# Patient Record
Sex: Female | Born: 1995
Health system: Southern US, Community
[De-identification: ages and names within clinical notes are randomized; demographics above are authoritative.]

## PROBLEM LIST (undated history)

## (undated) DIAGNOSIS — F32A Depression, unspecified: Secondary | ICD-10-CM

## (undated) DIAGNOSIS — R51 Headache: Secondary | ICD-10-CM

## (undated) DIAGNOSIS — F419 Anxiety disorder, unspecified: Secondary | ICD-10-CM

## (undated) DIAGNOSIS — R519 Headache, unspecified: Secondary | ICD-10-CM

## (undated) DIAGNOSIS — F988 Other specified behavioral and emotional disorders with onset usually occurring in childhood and adolescence: Secondary | ICD-10-CM

## (undated) DIAGNOSIS — J45909 Unspecified asthma, uncomplicated: Secondary | ICD-10-CM

## (undated) DIAGNOSIS — F329 Major depressive disorder, single episode, unspecified: Secondary | ICD-10-CM

## (undated) HISTORY — DX: Headache, unspecified: R51.9

## (undated) HISTORY — PX: SEPTOPLASTY: SUR1290

## (undated) HISTORY — DX: Anxiety disorder, unspecified: F41.9

## (undated) HISTORY — DX: Headache: R51

## (undated) HISTORY — DX: Unspecified asthma, uncomplicated: J45.909

## (undated) HISTORY — DX: Depression, unspecified: F32.A

---
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
## 2017-08-30 ENCOUNTER — Encounter (INDEPENDENT_AMBULATORY_CARE_PROVIDER_SITE_OTHER): Payer: Self-pay | Admitting: Pediatrics

## 2017-08-30 ENCOUNTER — Ambulatory Visit (INDEPENDENT_AMBULATORY_CARE_PROVIDER_SITE_OTHER): Payer: Commercial Managed Care - PPO | Admitting: Pediatrics

## 2017-08-30 ENCOUNTER — Telehealth (INDEPENDENT_AMBULATORY_CARE_PROVIDER_SITE_OTHER): Payer: Self-pay | Admitting: Pediatrics

## 2017-08-30 VITALS — BP 112/70 | HR 72 | Ht 66.75 in | Wt 183.0 lb

## 2017-08-30 DIAGNOSIS — G43009 Migraine without aura, not intractable, without status migrainosus: Secondary | ICD-10-CM | POA: Diagnosis not present

## 2017-08-30 DIAGNOSIS — G44219 Episodic tension-type headache, not intractable: Secondary | ICD-10-CM | POA: Diagnosis not present

## 2017-08-30 MED ORDER — MIGRELIEF 200-180-50 MG PO TABS: ORAL_TABLET | ORAL | 5 refills | Status: DC

## 2017-08-30 MED ORDER — ZOLMITRIPTAN 5 MG NA SOLN: NASAL | 5 refills | Status: DC

## 2017-08-30 NOTE — Patient Instructions (Signed)
I will write a note to your Associate August Saucer.  I also wrote a prescription for nasal zolmitriptan.  Please let me know if there is a problem.  My record shows that you are signed up for My Chart.  Make certain that we don't have to change this given her status is now that of an adult.  There are 3 lifestyle behaviors that are important to minimize headaches.  You should sleep 8-9 hours at night time.  Bedtime should be a set time for going to bed and waking up with few exceptions.  You need to drink about 48 ounces of water per day, more on days when you are out in the heat.  This works out to 3 - 16 ounce water bottles per day.  You may need to flavor the water so that you will be more likely to drink it.  Do not use Kool-Aid or other sugar drinks because they add empty calories and actually increase urine output.  You need to eat 3 meals per day.  You should not skip meals.  The meal does not have to be a big one.  Make daily entries into the headache calendar and sent it to me at the end of each calendar month.  I will call you or your parents and we will discuss the results of the headache calendar and make a decision about changing treatment if indicated.  You should take 400 mg of ibuprofen with 5 mg of nasal zolmitriptan at the onset of headaches that are severe enough to cause obvious pain and other symptoms.

## 2017-08-30 NOTE — Telephone Encounter (Signed)
Changed pharmacy to the [facility] on [address] and resent prescription.
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
## 2017-08-30 NOTE — Telephone Encounter (Signed)
Pharm[facility] [address]

## 2017-08-30 NOTE — Telephone Encounter (Signed)
°  Who's calling (name and relationship to patient) : Verdell Face (mom) Best contact number: [phone] Provider they see: Sharene Skeans  Reason for call: Mom called stated pharmacy need to be change to [facility] [address] due to insurance.     PRESCRIPTION REFILL ONLY  Name of prescription:  Pharmacy:

## 2017-08-31 ENCOUNTER — Encounter (INDEPENDENT_AMBULATORY_CARE_PROVIDER_SITE_OTHER): Payer: Self-pay | Admitting: Pediatrics

## 2017-09-13 ENCOUNTER — Encounter (INDEPENDENT_AMBULATORY_CARE_PROVIDER_SITE_OTHER): Payer: Self-pay | Admitting: Pediatrics

## 2017-09-13 DIAGNOSIS — F411 Generalized anxiety disorder: Secondary | ICD-10-CM

## 2017-09-13 NOTE — Telephone Encounter (Signed)
Headache calendar from October 2018 on Erin Sharp. 17 days were recorded.  9 days were headache free.  4 days were associated with tension type headaches, 3 required treatment.  There were 4 days of migraines, none were severe.  I recommended starting topiramate.  I contacted the patient by My Chart.

## 2017-09-15 MED ORDER — ESCITALOPRAM OXALATE 10 MG PO TABS: ORAL_TABLET | ORAL | 5 refills | Status: DC

## 2017-10-05 ENCOUNTER — Encounter (INDEPENDENT_AMBULATORY_CARE_PROVIDER_SITE_OTHER): Payer: Self-pay | Admitting: Pediatrics

## 2017-10-08 ENCOUNTER — Encounter (INDEPENDENT_AMBULATORY_CARE_PROVIDER_SITE_OTHER): Payer: Self-pay | Admitting: Pediatrics

## 2017-10-08 NOTE — Telephone Encounter (Signed)
Headache calendar from October 2018 on Erin Sharp. 31 days were recorded.  18 days were headache free.  7 days were associated with tension type headaches, 3 required treatment.  There were 6 days of migraines, none were severe.  Headache calendar from November 2018 on Erin Sharp. 8 days were recorded.  3 days were headache free.  3 days were associated with tension type headaches, 2 required treatment.  There were 2 days of migraines, none were severe.  She had 2 days of menstrual periods with no correlation with migraines.  There is no reason to change current treatment.  I will contact the family.

## 2017-10-10 ENCOUNTER — Encounter (INDEPENDENT_AMBULATORY_CARE_PROVIDER_SITE_OTHER): Payer: Self-pay | Admitting: Pediatrics

## 2017-11-16 ENCOUNTER — Telehealth (INDEPENDENT_AMBULATORY_CARE_PROVIDER_SITE_OTHER): Payer: Self-pay

## 2017-11-16 DIAGNOSIS — F411 Generalized anxiety disorder: Secondary | ICD-10-CM

## 2017-11-16 MED ORDER — ESCITALOPRAM OXALATE 10 MG PO TABS: ORAL_TABLET | ORAL | 5 refills | Status: DC

## 2017-11-16 NOTE — Telephone Encounter (Signed)
Rx has been sent to the pharmacy electronically. ° °

## 2018-09-11 ENCOUNTER — Other Ambulatory Visit: Payer: Self-pay | Admitting: Family Medicine

## 2018-09-11 DIAGNOSIS — R221 Localized swelling, mass and lump, neck: Secondary | ICD-10-CM

## 2018-10-02 ENCOUNTER — Inpatient Hospital Stay: Admission: RE | Admit: 2018-10-02 | Payer: Self-pay | Source: Ambulatory Visit

## 2019-01-08 ENCOUNTER — Other Ambulatory Visit (INDEPENDENT_AMBULATORY_CARE_PROVIDER_SITE_OTHER): Payer: Self-pay | Admitting: Pediatrics

## 2019-01-08 DIAGNOSIS — G43009 Migraine without aura, not intractable, without status migrainosus: Secondary | ICD-10-CM

## 2019-01-14 DIAGNOSIS — R197 Diarrhea, unspecified: Secondary | ICD-10-CM | POA: Diagnosis not present

## 2019-01-14 DIAGNOSIS — R112 Nausea with vomiting, unspecified: Secondary | ICD-10-CM | POA: Diagnosis not present

## 2019-01-15 ENCOUNTER — Other Ambulatory Visit: Payer: Self-pay | Admitting: Gastroenterology

## 2019-01-15 DIAGNOSIS — R1013 Epigastric pain: Secondary | ICD-10-CM

## 2019-01-15 DIAGNOSIS — R112 Nausea with vomiting, unspecified: Secondary | ICD-10-CM

## 2019-01-21 ENCOUNTER — Ambulatory Visit
Admission: RE | Admit: 2019-01-21 | Discharge: 2019-01-21 | Disposition: A | Discharge status: Home / Self Care | Payer: Commercial Managed Care - PPO | Source: Ambulatory Visit | Attending: Gastroenterology | Admitting: Gastroenterology

## 2019-01-21 DIAGNOSIS — K824 Cholesterolosis of gallbladder: Secondary | ICD-10-CM | POA: Diagnosis not present

## 2019-01-21 DIAGNOSIS — R1013 Epigastric pain: Secondary | ICD-10-CM

## 2019-01-21 DIAGNOSIS — R112 Nausea with vomiting, unspecified: Secondary | ICD-10-CM

## 2019-01-25 DIAGNOSIS — K2930 Chronic superficial gastritis without bleeding: Secondary | ICD-10-CM | POA: Diagnosis not present

## 2019-01-25 DIAGNOSIS — R112 Nausea with vomiting, unspecified: Secondary | ICD-10-CM | POA: Diagnosis not present

## 2019-01-25 DIAGNOSIS — R1013 Epigastric pain: Secondary | ICD-10-CM | POA: Diagnosis not present

## 2019-02-12 ENCOUNTER — Ambulatory Visit (INDEPENDENT_AMBULATORY_CARE_PROVIDER_SITE_OTHER): Admitting: Pediatrics

## 2019-06-27 ENCOUNTER — Ambulatory Visit (INDEPENDENT_AMBULATORY_CARE_PROVIDER_SITE_OTHER): Payer: Commercial Managed Care - PPO | Admitting: Physician Assistant

## 2019-06-27 ENCOUNTER — Other Ambulatory Visit: Payer: Self-pay

## 2019-06-27 ENCOUNTER — Encounter: Payer: Self-pay | Admitting: Physician Assistant

## 2019-06-27 DIAGNOSIS — F410 Panic disorder [episodic paroxysmal anxiety] without agoraphobia: Secondary | ICD-10-CM

## 2019-06-27 DIAGNOSIS — F331 Major depressive disorder, recurrent, moderate: Secondary | ICD-10-CM | POA: Diagnosis not present

## 2019-06-27 MED ORDER — HYDROXYZINE HCL 10 MG PO TABS: 10.0000 mg | ORAL_TABLET | Freq: Four times a day (QID) | ORAL | 0 refills | Status: DC | PRN

## 2019-06-27 MED ORDER — SERTRALINE HCL 100 MG PO TABS: 100.0000 mg | ORAL_TABLET | Freq: Every day | ORAL | 1 refills | Status: DC

## 2019-06-27 MED ORDER — BUPROPION HCL ER (XL) 150 MG PO TB24: ORAL_TABLET | ORAL | 1 refills | Status: DC
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
## 2019-07-22 ENCOUNTER — Other Ambulatory Visit: Payer: Self-pay | Admitting: Physician Assistant

## 2019-07-27 ENCOUNTER — Other Ambulatory Visit: Payer: Self-pay | Admitting: Physician Assistant

## 2019-08-02 ENCOUNTER — Encounter: Payer: Self-pay | Admitting: Physician Assistant

## 2019-08-02 ENCOUNTER — Ambulatory Visit (INDEPENDENT_AMBULATORY_CARE_PROVIDER_SITE_OTHER): Payer: Commercial Managed Care - PPO | Admitting: Physician Assistant

## 2019-08-02 DIAGNOSIS — F410 Panic disorder [episodic paroxysmal anxiety] without agoraphobia: Secondary | ICD-10-CM

## 2019-08-02 DIAGNOSIS — F331 Major depressive disorder, recurrent, moderate: Secondary | ICD-10-CM

## 2019-08-02 DIAGNOSIS — F900 Attention-deficit hyperactivity disorder, predominantly inattentive type: Secondary | ICD-10-CM | POA: Diagnosis not present

## 2019-08-02 MED ORDER — BUPROPION HCL ER (XL) 150 MG PO TB24: ORAL_TABLET | ORAL | 1 refills | Status: DC

## 2019-08-02 MED ORDER — ALPRAZOLAM 0.25 MG PO TABS: 0.1250 mg | ORAL_TABLET | Freq: Two times a day (BID) | ORAL | 0 refills | Status: DC | PRN

## 2019-08-02 MED ORDER — SERTRALINE HCL 100 MG PO TABS: 150.0000 mg | ORAL_TABLET | Freq: Every day | ORAL | 1 refills | Status: DC

## 2019-08-02 NOTE — Progress Notes (Signed)
[facility]  Patient ID: ILLA ENLOW,  MRN: [id number]  PCP: Aretta Nip, MD  Date of Evaluation: 08/02/2019 Time spent:15 minutes  Chief Complaint:  Chief Complaint    Anxiety; Depression; Follow-up     Virtual Visit via Telephone Note  I connected with patient by a video enabled telemedicine application or telephone, with their informed consent, and verified patient privacy and that I am speaking with the correct person using two identifiers.  I am private, in my home and the patient is home.   I discussed the limitations, risks, security and privacy concerns of performing an evaluation and management service by telephone and the availability of in person appointments. I also discussed with the patient that there may be a patient responsible charge related to this service. The patient expressed understanding and agreed to proceed.   I discussed the assessment and treatment plan with the patient. The patient was provided an opportunity to ask questions and all were answered. The patient agreed with the plan and demonstrated an understanding of the instructions.   The patient was advised to call back or seek an in-person evaluation if the symptoms worsen or if the condition fails to improve as anticipated.  I provided 15 minutes of non-face-to-face time during this encounter.  HISTORY/CURRENT STATUS: HPI For 6 week med check.  At [facility], we increased Zoloft and started hydroxyzine, which didn't help at all and made her sleepy. Has PA 1-2 per day, can't get her breath, has chest discomfort, sweaty palms, sometimes last for 30 minutes.  She uses breathing techniques that her therapists has taught her and that helps some.  "I feel like I'm dying."   Has a hard time focusing, gets distracted easily.  Thinks that sometimes the panic happens because she can't stay on task.  We had discussed her having psychological testing for ADD/ADHD but she has been unable to get in  anywhere.  The clinic at [facility] has a backlog and she will not be able to get in until the end of next semester.  She has been sad lately but it is triggered by the fact that her boyfriend was cheating on her and she had to kick him out.  "I am okay though."  She still has low energy and motivation.  Not isolating any more than she has to due to the coronavirus pandemic.  Denies suicidal or homicidal thoughts.  Patient denies increased energy with decreased need for sleep, no increased talkativeness, no racing thoughts, no impulsivity or risky behaviors, no increased spending, no increased libido, no grandiosity.  Denies dizziness, syncope, seizures, numbness, tingling, tremor, tics, unsteady gait, slurred speech, confusion. Denies muscle or joint pain, stiffness, or dystonia.  Individual Medical History/ Review of Systems: Changes? :No    Past medications for mental health diagnoses include: Lexapro never helped, hydroxyzine is ineffective and cause drowsiness  Allergies: Patient has no known allergies.  Current Medications:  Current Outpatient Medications:  .  buPROPion (WELLBUTRIN XL) 150 MG 24 hr tablet, TAKE 1 TABLET BY MOUTH EVERY DAY IN THE MORNING, Disp: 30 tablet, Rfl: 1 .  cholecalciferol (VITAMIN D3) 25 MCG (1000 UT) tablet, Take 1,000 Units by mouth daily., Disp: , Rfl:  .  norethindrone (MICRONOR) 0.35 MG tablet, , Disp: , Rfl:  .  zolmitriptan (ZOMIG) 5 MG nasal solution, 1 puff in nostril at onset of migraine, may repeat in 2 hours, Disp: 6 Units, Rfl: 5 .  ALPRAZolam (XANAX) 0.25 MG tablet, Take 0.5-1  tablets (0.125-0.25 mg total) by mouth 2 (two) times daily as needed for anxiety., Disp: 20 tablet, Rfl: 0 .  MIGRELIEF 200-180-50 MG TABS, Take 2 tablets daily (Patient not taking: Reported on 06/27/2019), Disp: 60 tablet, Rfl: 5 .  sertraline (ZOLOFT) 100 MG tablet, Take 1.5 tablets (150 mg total) by mouth daily., Disp: 45 tablet, Rfl: 1 Medication Side Effects: none   Family  Medical/ Social History: Changes? Yes boyfriend was cheating on her, so she kicked him out.   MENTAL HEALTH EXAM:  There were no vitals taken for this visit.There is no height or weight on file to calculate BMI.  General Appearance: unable to assess  Eye Contact:  unable to assess  Speech:  Clear and Coherent  Volume:  Normal  Mood:  Euthymic  Affect:  unable to assess  Thought Process:  Goal Directed  Orientation:  Full (Time, Place, and Person)  Thought Content: Logical   Suicidal Thoughts:  No  Homicidal Thoughts:  No  Memory:  WNL  Judgement:  Good  Insight:  Good  Psychomotor Activity:  unable to assess  Concentration:  Concentration: Good  Recall:  Good  Fund of Knowledge: Good  Language: Good  Assets:  Desire for Improvement  ADL's:  Intact  Cognition: WNL  Prognosis:  Good    DIAGNOSES:    ICD-10-CM   1. Major depressive disorder, recurrent episode, moderate (HCC)  F33.1   2. Panic disorder  F41.0   3. Attention deficit hyperactivity disorder (ADHD), predominantly inattentive type  F90.0     Receiving Psychotherapy: Yes    RECOMMENDATIONS:  PDMP was reviewed. Increase Zoloft to 150 mg to help prevent panic attacks and generalized anxiety. Continue Wellbutrin XL 150 mg q am. Start Xanax 0.25 mg 1/2-1 po bid prn. Discussed benefits, risks, SE all discussed.  She is aware to take this for emergencies only and to take sparingly. Discontinue hydroxyzine. At the next visit, if the anxiety is improved and because I strongly suspect ADD, I might go ahead and treat with a stimulant or at least consider clonidine or guanfacine or Strattera. Continue therapy. Return in 6 weeks.   Melony Overlyeresa Edita Weyenberg, PA-C   This record has been created using [employer] software.  Chart creation errors have been sought, but may not always have been located and corrected. Such creation errors do not reflect on the standard of medical care.

## 2019-08-24 IMAGING — US US ABDOMEN COMPLETE
1 series · 13 of 25 positions shown · non-contrast
Comparison: None.

CLINICAL DATA: Nausea, vomiting, epigastric pain

EXAM:
ABDOMEN ULTRASOUND COMPLETE

[Series 1: us abdomen complete · 0.22mm/px · 13 of 88 slices shown]
[im 1/88]
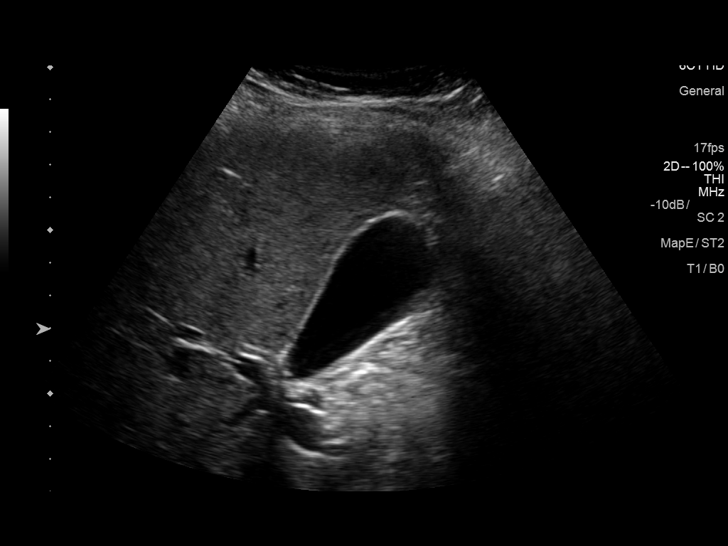
[im 8/88]
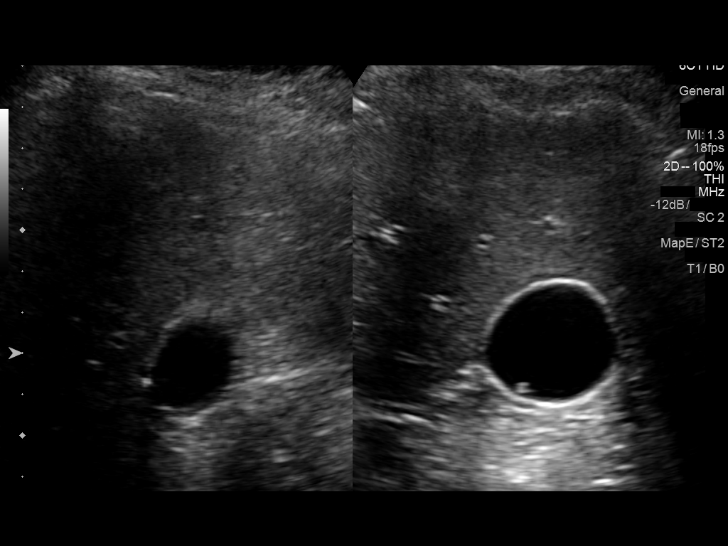
[im 15/88]
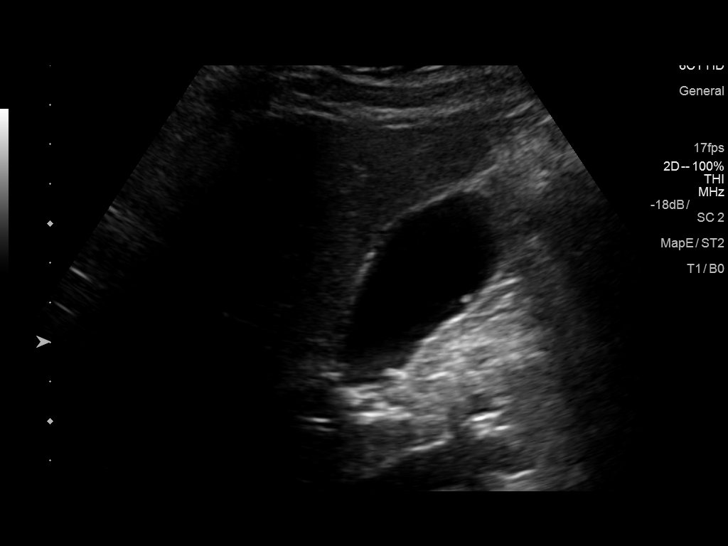
[im 22/88]
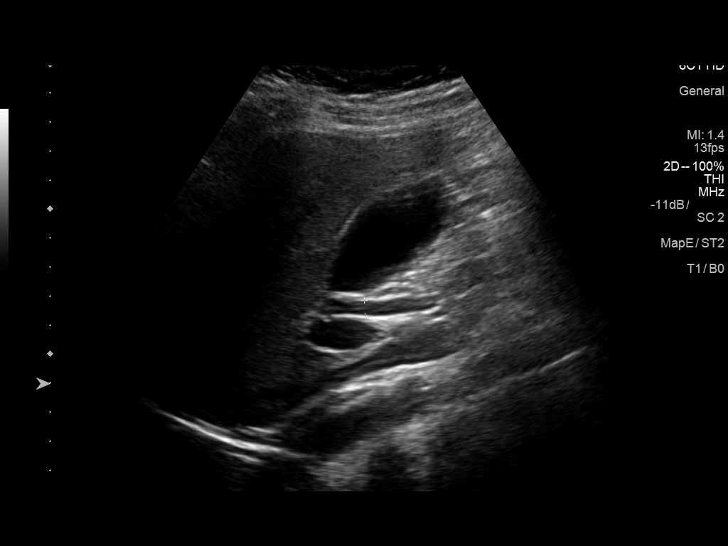
[im 30/88]
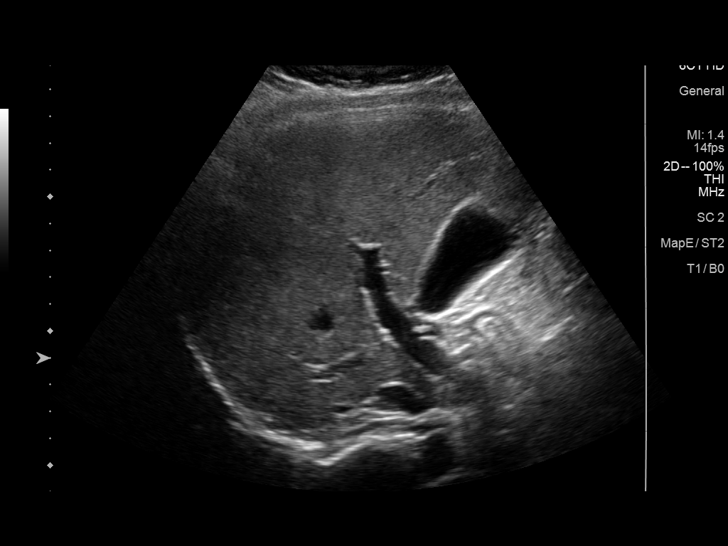
[im 37/88]
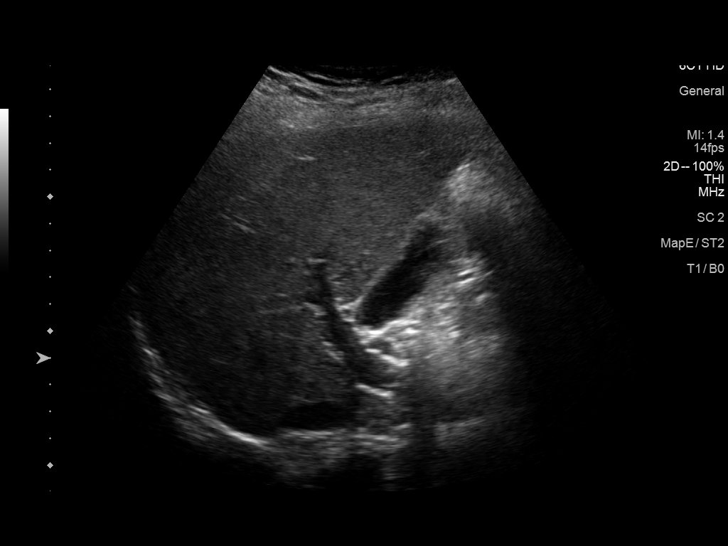
[im 44/88]
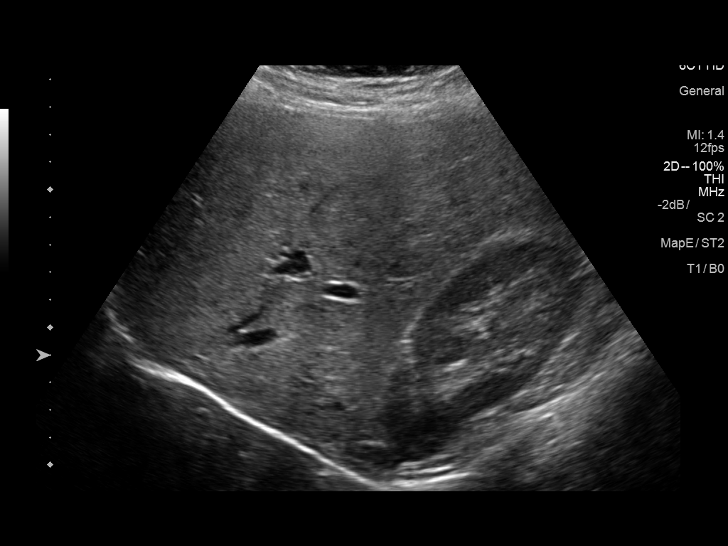
[im 51/88]
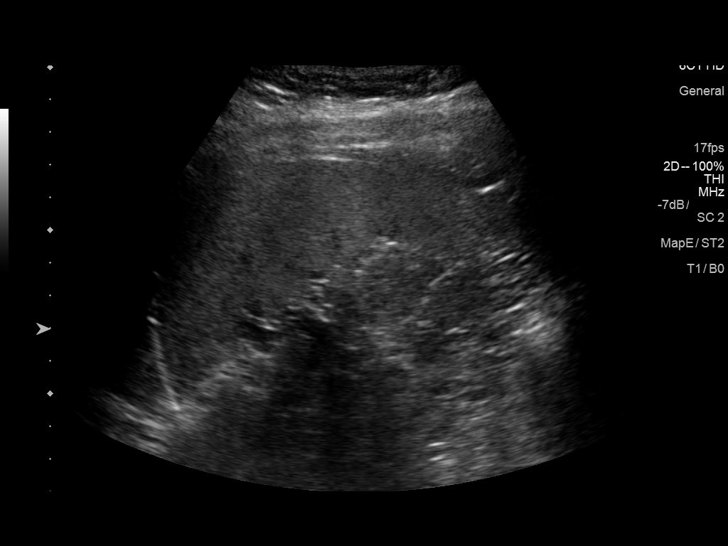
[im 59/88]
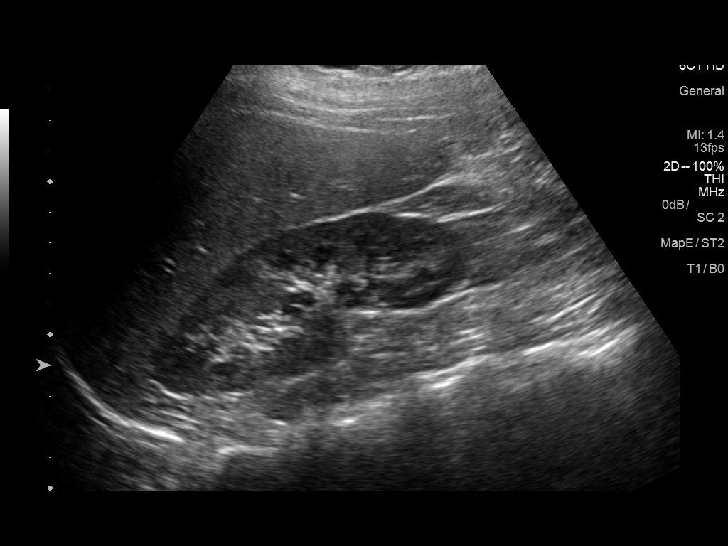
[im 66/88]
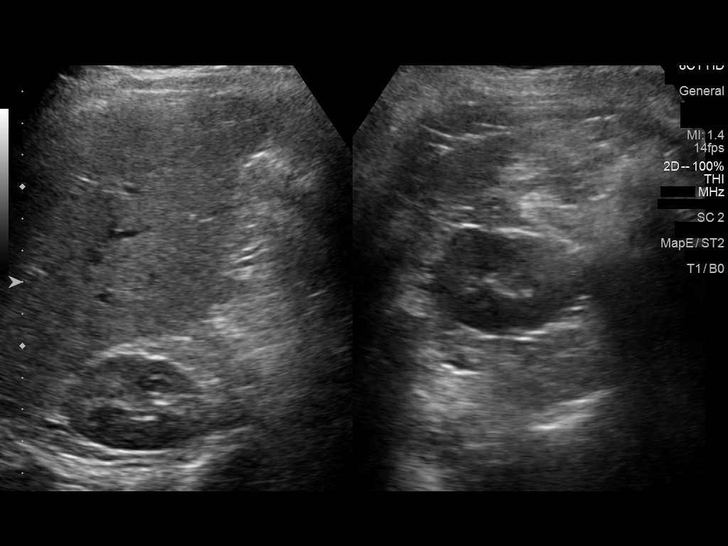
[im 73/88]
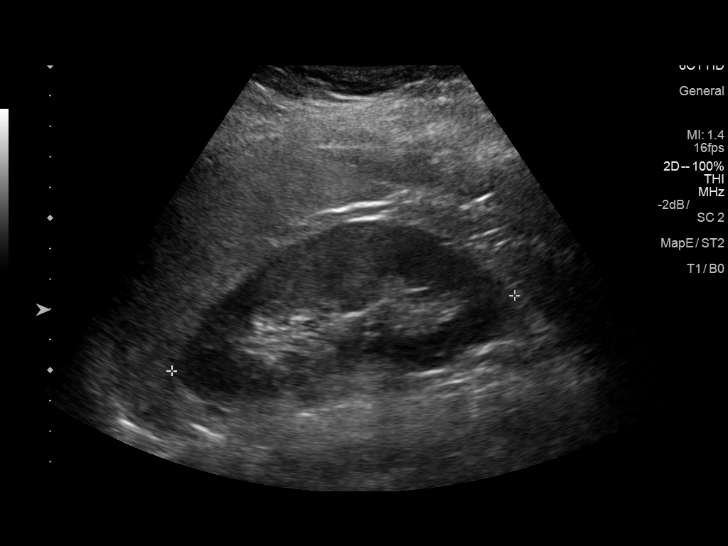
[im 80/88]
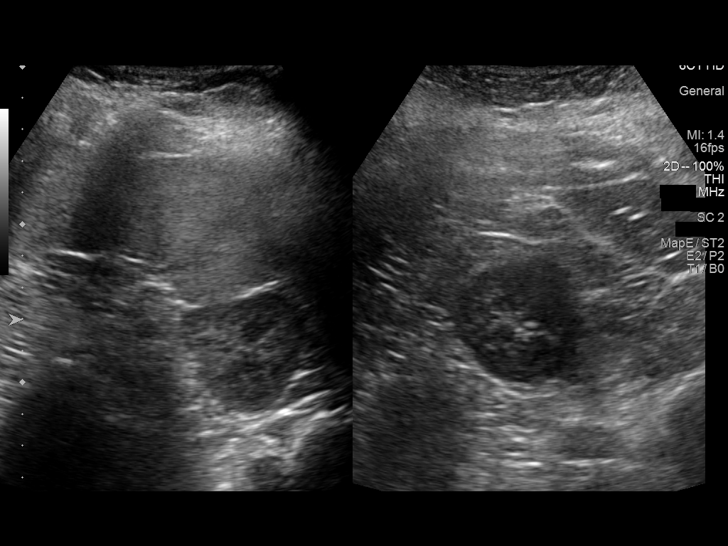
[im 88/88]
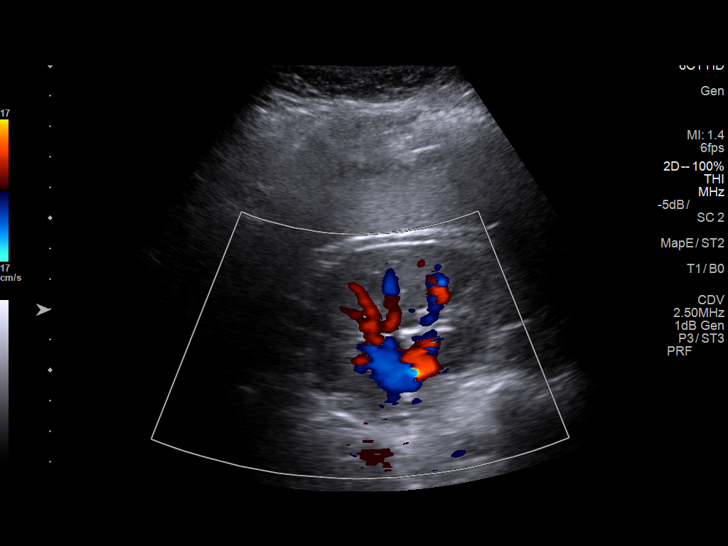

[13 of 25 positions shown; findings below may reference images not displayed]

FINDINGS: Gallbladder: No mobile gallstones or wall thickening visualized. Two
small nonmobile nonshadowing adherent gallstones or polyps,
measuring 3 mm and 4 mm. No sonographic Murphy sign noted by
sonographer.

Common bile duct: Diameter: 5 mm

Liver: No focal lesion identified. Within normal limits in
parenchymal echogenicity. Portal vein is patent on color Doppler
imaging with normal direction of blood flow towards the liver.

IVC: No abnormality visualized.

Pancreas: Poorly visualized.

Spleen: Size and appearance within normal limits.

Right Kidney: Length: 11.1 cm. Echogenicity within normal limits. No
mass or hydronephrosis visualized.

Left Kidney: Length: 11.5 cm. Echogenicity within normal limits. No
mass or hydronephrosis visualized.

Abdominal aorta: No aneurysm visualized.

Other findings: None.
IMPRESSION: 1. Two small adherent gallstones or polyps measuring 3 mm and 4 mm.
The great majority of gallbladder polyps are benign cholesterol
polyps. Recommend ultrasound follow-up at 1 year, 3 years, and 5
years for these small polyps to ensure stability.

2. No other ultrasound abnormality of the right upper quadrant to
explain pain, nausea, or vomiting.

## 2019-10-01 ENCOUNTER — Encounter: Payer: Self-pay | Admitting: Physician Assistant

## 2019-10-01 ENCOUNTER — Telehealth: Payer: Self-pay | Admitting: Physician Assistant

## 2019-10-01 ENCOUNTER — Other Ambulatory Visit: Payer: Self-pay

## 2019-10-01 ENCOUNTER — Ambulatory Visit (INDEPENDENT_AMBULATORY_CARE_PROVIDER_SITE_OTHER): Payer: Commercial Managed Care - PPO | Admitting: Physician Assistant

## 2019-10-01 VITALS — BP 120/82 | HR 78

## 2019-10-01 DIAGNOSIS — F410 Panic disorder [episodic paroxysmal anxiety] without agoraphobia: Secondary | ICD-10-CM | POA: Diagnosis not present

## 2019-10-01 DIAGNOSIS — F900 Attention-deficit hyperactivity disorder, predominantly inattentive type: Secondary | ICD-10-CM

## 2019-10-01 DIAGNOSIS — F331 Major depressive disorder, recurrent, moderate: Secondary | ICD-10-CM

## 2019-10-01 MED ORDER — SERTRALINE HCL 100 MG PO TABS: 150.0000 mg | ORAL_TABLET | Freq: Every day | ORAL | 1 refills | Status: DC

## 2019-10-01 MED ORDER — ATOMOXETINE HCL 40 MG PO CAPS: 40.0000 mg | ORAL_CAPSULE | Freq: Every day | ORAL | 1 refills | Status: DC

## 2019-10-01 MED ORDER — ALPRAZOLAM 0.25 MG PO TABS: 0.1250 mg | ORAL_TABLET | Freq: Two times a day (BID) | ORAL | 0 refills | Status: DC | PRN

## 2019-10-01 NOTE — Progress Notes (Signed)
[facility]  Patient ID: Erin Sharp,  MRN: [id number]  PCP: Aretta Nip, MD  Date of Evaluation: 10/01/2019 Time spent:15 minutes  Chief Complaint:  Chief Complaint    Anxiety; ADD; Follow-up      HISTORY/CURRENT STATUS: HPI for routine med check.  Patient still having a lot of panic attacks.  Having trouble in her college classes because of it.  Has trouble going outside of her home.  Under a lot more stress.  See social history.  Unable to focus.  Gets distracted easily and has a hard time finishing projects.  She has a hard time enjoying things.  Energy and motivation can be low but sometimes are normal.  Not isolating any more than she has to due to the coronavirus pandemic.  Denies suicidal or homicidal thoughts.  Feels that the Wellbutrin has never helped.  Patient denies increased energy with decreased need for sleep, no increased talkativeness, no racing thoughts, no impulsivity or risky behaviors, no increased spending, no increased libido, no grandiosity.  Denies dizziness, syncope, seizures, numbness, tingling, tremor, tics, unsteady gait, slurred speech, confusion. Denies muscle or joint pain, stiffness, or dystonia.  Individual Medical History/ Review of Systems: Changes? :No    Past medications for mental health diagnoses include: Lexapro never helped, hydroxyzine is ineffective and cause drowsiness  Allergies: Patient has no known allergies.  Current Medications:  Current Outpatient Medications:  .  ALPRAZolam (XANAX) 0.25 MG tablet, Take 0.5-1 tablets (0.125-0.25 mg total) by mouth 2 (two) times daily as needed for anxiety., Disp: 20 tablet, Rfl: 0 .  cetirizine (ZYRTEC) 10 MG tablet, Take 10 mg by mouth daily., Disp: , Rfl:  .  cholecalciferol (VITAMIN D3) 25 MCG (1000 UT) tablet, Take 1,000 Units by mouth daily., Disp: , Rfl:  .  norethindrone (MICRONOR) 0.35 MG tablet, , Disp: , Rfl:  .  sertraline (ZOLOFT) 100 MG tablet, Take 1.5  tablets (150 mg total) by mouth daily., Disp: 45 tablet, Rfl: 1 .  zolmitriptan (ZOMIG) 5 MG nasal solution, 1 puff in nostril at onset of migraine, may repeat in 2 hours, Disp: 6 Units, Rfl: 5 .  atomoxetine (STRATTERA) 40 MG capsule, Take 1 capsule (40 mg total) by mouth daily., Disp: 30 capsule, Rfl: 1 .  MIGRELIEF 200-180-50 MG TABS, Take 2 tablets daily (Patient not taking: Reported on 06/27/2019), Disp: 60 tablet, Rfl: 5 Medication Side Effects: none  Family Medical/ Social History: Changes? Yes Grandfather died, she lost one of her jobs, not in school now b/c she can't do it b/c anxiety.  She is still doing real estate and showing houses.   MENTAL HEALTH EXAM:  Blood pressure 120/82, pulse 78.There is no height or weight on file to calculate BMI.  General Appearance: Casual, Neat and Well Groomed  Eye Contact:  Good  Speech:  Clear and Coherent  Volume:  Normal  Mood:  Anxious and Depressed  Affect:  Anxious  Thought Process:  Goal Directed and Descriptions of Associations: Intact  Orientation:  Full (Time, Place, and Person)  Thought Content: Logical   Suicidal Thoughts:  No  Homicidal Thoughts:  No  Memory:  WNL  Judgement:  Good  Insight:  Good  Psychomotor Activity:  Fidgety  Concentration:  Concentration: Fair and Attention Span: Fair  Recall:  Good  Fund of Knowledge: Good  Language: Good  Assets:  Desire for Improvement  ADL's:  Intact  Cognition: WNL  Prognosis:  Good    DIAGNOSES:  ICD-10-CM   1. Attention deficit hyperactivity disorder (ADHD), predominantly inattentive type  F90.0   2. Panic disorder  F41.0   3. Major depressive disorder, recurrent episode, moderate (HCC)  F33.1     Receiving Psychotherapy: Yes  Rose Phi, [facility]    RECOMMENDATIONS:  PDMP was reviewed. We discussed different options as far as the ADD goes.  Because she is needing a benzo occasionally, and the anxiety is so severe, I do not want to add a stimulant.   We discussed clonidine, guanfacine, and Strattera.  We agreed to start the Strattera.  Benefits, risks, and side effects were discussed and she accepts. Because she is having so much trouble with the anxiety and the ADD she is unable to do her schoolwork.  She would like to do a medical withdrawal this semester.  I am completely fine with that and will fill out any forms or write a letter, which ever she needs, to make that happen so things will count against her. Start Strattera 40 mg p.o. every morning. Wean off Wellbutrin XL 150 mg, one half p.o. daily for 1 week and then stop.  This has not been beneficial for her and it may be increasing her anxiety. Continue Zoloft 100 mg, 1-1/2 pills daily. Continue Xanax 0.25 mg, 1/2-1 twice daily as needed. She asks about an emotional support animal.  She has a new puppy and would like to have it trained for that.  I have asked her to call [facility] and get info from them.  If she needs a letter from me for an emotional support animal, I will write that for her. Continue therapy with Rose Phi at [facility]. Return in 4 weeks.  Melony Overly, PA-C

## 2019-10-02 ENCOUNTER — Telehealth: Payer: Self-pay

## 2019-10-02 NOTE — Telephone Encounter (Signed)
error 

## 2019-10-02 NOTE — Telephone Encounter (Signed)
Prior authorization submitted and approved for Atomoxetine 40 mg capsules effective 10/02/2019-10/01/2020 through [facility] notified

## 2019-10-23 ENCOUNTER — Other Ambulatory Visit: Payer: Self-pay | Admitting: Physician Assistant

## 2019-10-24 ENCOUNTER — Other Ambulatory Visit: Payer: Self-pay | Admitting: Physician Assistant

## 2019-10-29 ENCOUNTER — Telehealth: Payer: Self-pay | Admitting: Physician Assistant

## 2019-10-31 ENCOUNTER — Ambulatory Visit (INDEPENDENT_AMBULATORY_CARE_PROVIDER_SITE_OTHER): Payer: Commercial Managed Care - PPO | Admitting: Physician Assistant

## 2019-10-31 ENCOUNTER — Encounter: Payer: Self-pay | Admitting: Physician Assistant

## 2019-10-31 ENCOUNTER — Other Ambulatory Visit: Payer: Self-pay

## 2019-10-31 DIAGNOSIS — F410 Panic disorder [episodic paroxysmal anxiety] without agoraphobia: Secondary | ICD-10-CM | POA: Diagnosis not present

## 2019-10-31 DIAGNOSIS — F900 Attention-deficit hyperactivity disorder, predominantly inattentive type: Secondary | ICD-10-CM | POA: Diagnosis not present

## 2019-10-31 DIAGNOSIS — F331 Major depressive disorder, recurrent, moderate: Secondary | ICD-10-CM

## 2019-10-31 MED ORDER — TRAZODONE HCL 50 MG PO TABS: 50.0000 mg | ORAL_TABLET | Freq: Every evening | ORAL | 1 refills | Status: DC | PRN

## 2019-10-31 MED ORDER — ATOMOXETINE HCL 60 MG PO CAPS: 60.0000 mg | ORAL_CAPSULE | Freq: Every day | ORAL | 1 refills | Status: DC

## 2019-10-31 NOTE — Progress Notes (Signed)
[facility]  Patient ID: Erin Sharp,  MRN: [id number]  PCP: Clayborn Heron, MD  Date of Evaluation: 10/31/2019 Time spent:15 minutes  Chief Complaint:  Chief Complaint    ADD; Anxiety; Depression; Follow-up      HISTORY/CURRENT STATUS: HPI for 1 month med check after starting Strattera.  Several good things have happened by adding the Strattera.  She is a little more focused and not having thoughts "all over the place."  Also she feels that her anxiety is better.  She does not like to take the Xanax and rarely does so.  She uses her coping techniques that she is learned in counseling, which are helping her quite a bit now.  Before starting the Strattera they were not helpful.  The downside to adding the Strattera is nausea.  She has it every morning after taking the Strattera.  It does not matter whether she eats or not.  She is having more trouble sleeping.  Cannot fall asleep because her mind is racing a lot.  That part is better since going on the Strattera but still having trouble falling asleep.  Usually gets about 6 to 7 hours.  Patient denies loss of interest in usual activities and is able to enjoy things.  Denies decreased energy or motivation.  Appetite has not changed.  No extreme sadness, tearfulness, or feelings of hopelessness.  Denies any changes in concentration, making decisions or remembering things.  Denies suicidal or homicidal thoughts.  Denies dizziness, syncope, seizures, numbness, tingling, tremor, tics, unsteady gait, slurred speech, confusion. Denies muscle or joint pain, stiffness, or dystonia.  Individual Medical History/ Review of Systems: Changes? :No    Past medications for mental health diagnoses include: Lexapro never helped,hydroxyzine is ineffective and cause drowsiness  Allergies: Patient has no known allergies.  Current Medications:  Current Outpatient Medications:  .  ALPRAZolam (XANAX) 0.25 MG tablet, Take 0.5-1  tablets (0.125-0.25 mg total) by mouth 2 (two) times daily as needed for anxiety., Disp: 20 tablet, Rfl: 0 .  cetirizine (ZYRTEC) 10 MG tablet, Take 10 mg by mouth daily., Disp: , Rfl:  .  cholecalciferol (VITAMIN D3) 25 MCG (1000 UT) tablet, Take 1,000 Units by mouth daily., Disp: , Rfl:  .  norethindrone (MICRONOR) 0.35 MG tablet, , Disp: , Rfl:  .  sertraline (ZOLOFT) 100 MG tablet, TAKE 1.5 TABLETS BY MOUTH DAILY, Disp: 135 tablet, Rfl: 0 .  zolmitriptan (ZOMIG) 5 MG nasal solution, 1 puff in nostril at onset of migraine, may repeat in 2 hours, Disp: 6 Units, Rfl: 5 .  atomoxetine (STRATTERA) 60 MG capsule, Take 1 capsule (60 mg total) by mouth at bedtime., Disp: 30 capsule, Rfl: 1 .  MIGRELIEF 200-180-50 MG TABS, Take 2 tablets daily (Patient not taking: Reported on 06/27/2019), Disp: 60 tablet, Rfl: 5 .  traZODone (DESYREL) 50 MG tablet, Take 1-2 tablets (50-100 mg total) by mouth at bedtime as needed for sleep., Disp: 60 tablet, Rfl: 1 Medication Side Effects: nausea  Family Medical/ Social History: Changes? Yes due to these medical reason she has been able to go to class at [employer].  MENTAL HEALTH EXAM:  There were no vitals taken for this visit.There is no height or weight on file to calculate BMI.  General Appearance: Casual, Neat and Well Groomed  Eye Contact:  Good  Speech:  Clear and Coherent  Volume:  Normal  Mood:  Euthymic  Affect:  Appropriate  Thought Process:  Goal Directed and Descriptions of Associations: Intact  Orientation:  Full (Time, Place, and Person)  Thought Content: Logical   Suicidal Thoughts:  No  Homicidal Thoughts:  No  Memory:  WNL  Judgement:  Good  Insight:  Good  Psychomotor Activity:  Normal  Concentration:  Concentration: Good and Attention Span: Good  Recall:  Good  Fund of Knowledge: Good  Language: Good  Assets:  Desire for Improvement  ADL's:  Intact  Cognition: WNL  Prognosis:  Good    DIAGNOSES:    ICD-10-CM   1. Major depressive  disorder, recurrent episode, moderate (HCC)  F33.1   2. Panic disorder  F41.0   3. Attention deficit hyperactivity disorder (ADHD), predominantly inattentive type  F90.0     Receiving Psychotherapy: Yes Dessiree Price at [facility]   RECOMMENDATIONS:  Needs a note for [employer] w/ dx and meds, saying why she wasn't able to go to classes, so she won't get kicked out of school.  Note written on a prescription slip stating "I treat her for depression, panic disorder, and ADD.  She is compliant with her meds and keeping appointments.  Med changes are being made, currently on Strattera, Zoloft, and Xanax.  Adding trazodone for sleep.  Please do not penalize her for missing classes due to medical reasons." Since she is some better on the Strattera with anxiety as well as ADD, we agreed to stay on that but increase the dose and change the timing to evening so hopefully she will not experience any nausea. Increase Strattera to 60 mg q. at bedtime. Start trazodone 50 mg, 1 or 2 p.o. nightly as needed sleep.  Sleep hygiene was discussed. Continue Zoloft 100 mg, 1.5 pills daily. Continue Xanax 0.25 mg twice daily as needed.  She is taking it rarely. We did discuss other treatments for ADHD/ADD, including stimulants.  We agree that it is best to maximize Strattera, as long as the nausea improves, will stay on that since it is helpful.  We may need to consider adding Adderall or one of the other stimulants in the future however. Continue therapy at [facility]. Return in 1 month.  Donnal Moat, PA-C

## 2019-11-22 ENCOUNTER — Other Ambulatory Visit: Payer: Self-pay | Admitting: Physician Assistant

## 2019-12-05 ENCOUNTER — Ambulatory Visit (INDEPENDENT_AMBULATORY_CARE_PROVIDER_SITE_OTHER): Payer: Commercial Managed Care - PPO | Admitting: Physician Assistant

## 2019-12-05 ENCOUNTER — Other Ambulatory Visit: Payer: Self-pay

## 2019-12-05 ENCOUNTER — Encounter: Payer: Self-pay | Admitting: Physician Assistant

## 2019-12-05 VITALS — BP 113/75 | HR 82

## 2019-12-05 DIAGNOSIS — F410 Panic disorder [episodic paroxysmal anxiety] without agoraphobia: Secondary | ICD-10-CM | POA: Diagnosis not present

## 2019-12-05 DIAGNOSIS — F900 Attention-deficit hyperactivity disorder, predominantly inattentive type: Secondary | ICD-10-CM | POA: Diagnosis not present

## 2019-12-05 DIAGNOSIS — F331 Major depressive disorder, recurrent, moderate: Secondary | ICD-10-CM | POA: Diagnosis not present

## 2019-12-05 DIAGNOSIS — R110 Nausea: Secondary | ICD-10-CM | POA: Diagnosis not present

## 2019-12-05 MED ORDER — ALPRAZOLAM 0.25 MG PO TABS: 0.1250 mg | ORAL_TABLET | Freq: Two times a day (BID) | ORAL | 0 refills | Status: DC | PRN

## 2019-12-05 MED ORDER — COTEMPLA XR-ODT 8.6 MG PO TBED: 8.6000 mg | EXTENDED_RELEASE_TABLET | ORAL | 0 refills | Status: DC

## 2019-12-05 NOTE — Progress Notes (Signed)
[facility]  Patient ID: Erin Sharp,  MRN: [id number]  PCP: Aretta Nip, MD  Date of Evaluation: 12/05/2019 Time spent:25 minutes  Chief Complaint:  Chief Complaint    Follow-up      HISTORY/CURRENT STATUS: HPI for routine med check.  Patient is still having a lot of nausea secondary to the Strattera.  We have changed the time of dosing around in hopes that that would help but it has not.  Also it is not beneficial as far as focus and completing task is concerned.  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  States she gets distracted easily and cannot concentrate to finish things.  Her job requires a lot of accurate paperwork and it is really hard for her.  As far as the anxiety goes, she has been better.  Not needing the Xanax nearly as often.  She may go weeks between needing it.  She does still have panic attacks occasionally that happen randomly, without specific triggers.  She sleeps well with trazodone.  That has been helpful addition.  Feels that the Zoloft is working well.  She is able to enjoy things.  Energy and motivation are good.  Not crying easily.  No suicidal or homicidal thoughts.  Thinks it has helped with the anxiety.  Denies dizziness, syncope, seizures, numbness, tingling, tremor, tics, unsteady gait, slurred speech, confusion. Denies muscle or joint pain, stiffness, or dystonia.  Individual Medical History/ Review of Systems: Changes? :No    Past medications for mental health diagnoses include: Lexapro never helped,hydroxyzine is ineffective and cause drowsiness  Allergies: Patient has no known allergies.  Current Medications:  Current Outpatient Medications:  .  ALPRAZolam (XANAX) 0.25 MG tablet, Take 0.5-1 tablets (0.125-0.25 mg total) by mouth 2 (two) times daily as needed for anxiety., Disp: 20 tablet, Rfl: 0 .  cetirizine (ZYRTEC) 10 MG tablet, Take 10 mg by mouth daily., Disp: , Rfl:  .   cholecalciferol (VITAMIN D3) 25 MCG (1000 UT) tablet, Take 1,000 Units by mouth daily., Disp: , Rfl:  .  norethindrone (MICRONOR) 0.35 MG tablet, , Disp: , Rfl:  .  sertraline (ZOLOFT) 100 MG tablet, TAKE 1.5 TABLETS BY MOUTH DAILY, Disp: 135 tablet, Rfl: 0 .  traZODone (DESYREL) 50 MG tablet, TAKE 1-2 TABLETS (50-100 MG TOTAL) BY MOUTH AT BEDTIME AS NEEDED FOR SLEEP., Disp: 180 tablet, Rfl: 0 .  zolmitriptan (ZOMIG) 5 MG nasal solution, 1 puff in nostril at onset of migraine, may repeat in 2 hours, Disp: 6 Units, Rfl: 5 .  Methylphenidate (COTEMPLA XR-ODT) 8.6 MG TBED, Take 8.6 mg by mouth every morning., Disp: 30 tablet, Rfl: 0 .  MIGRELIEF 200-180-50 MG TABS, Take 2 tablets daily (Patient not taking: Reported on 06/27/2019), Disp: 60 tablet, Rfl: 5 Medication Side Effects: fatigue/weakness and nausea  Family Medical/ Social History: Changes? No  MENTAL HEALTH EXAM:  Blood pressure 113/75, pulse 82.There is no height or weight on file to calculate BMI.  General Appearance: Casual, Neat and Well Groomed  Eye Contact:  Good  Speech:  Clear and Coherent  Volume:  Normal  Mood:  Euthymic  Affect:  Appropriate  Thought Process:  Goal Directed and Descriptions of Associations: Intact  Orientation:  Full (Time, Place, and Person)  Thought Content: Logical   Suicidal Thoughts:  No  Homicidal Thoughts:  No  Memory:  WNL  Judgement:  Good  Insight:  Good  Psychomotor Activity:  Normal  Concentration:  Concentration: Fair and Attention Span: Fair  Recall:  Good  Fund of Knowledge: Good  Language: Good  Assets:  Desire for Improvement  ADL's:  Intact  Cognition: WNL  Prognosis:  Good    DIAGNOSES:    ICD-10-CM   1. Attention deficit hyperactivity disorder (ADHD), predominantly inattentive type  F90.0   2. Major depressive disorder, recurrent episode, moderate (HCC)  F33.1   3. Panic disorder  F41.0   4. Nausea without vomiting  R11.0     Receiving Psychotherapy: Yes Keyleigh Price  at [facility]   RECOMMENDATIONS:  I spent 30 minutes with her discussing her diagnosis and treatment options. PDMP was reviewed. Since she is not needing the Xanax very often now and the Strattera is causing nausea and is not beneficial, I think she will benefit from a stimulant for the ADD.  We discussed the different options, classes of medications, risks, benefits, and side effects.  Addictive potential, the fact that the stimulant class has street value and can be stolen.  She would like to try a stimulant.  She understands that she needs to keep it locked up and has already had a conversation with her parents concerning this, if I did recommend starting her on 1. Start cotemplate XR ODT 8.6 mg daily.   Continue Xanax 0.25 mg twice daily as needed emergencies only.  This was discussed with her.  She needs to make this prescription last several months at least. Continue Zoloft 100 mg, 1-1/2 pills daily. Continue trazodone 50 mg, 1-2 nightly as needed. Continue therapy with Rose Phi. Return in 4 weeks.   Melony Overly, PA-C

## 2020-01-07 ENCOUNTER — Ambulatory Visit: Payer: Commercial Managed Care - PPO | Admitting: Physician Assistant

## 2020-01-21 ENCOUNTER — Other Ambulatory Visit: Payer: Self-pay | Admitting: Physician Assistant

## 2020-02-03 ENCOUNTER — Ambulatory Visit: Payer: Commercial Managed Care - PPO | Admitting: Physician Assistant

## 2020-02-26 ENCOUNTER — Other Ambulatory Visit: Payer: Self-pay | Admitting: Physician Assistant

## 2020-03-27 ENCOUNTER — Ambulatory Visit: Payer: Commercial Managed Care - PPO | Admitting: Physician Assistant

## 2020-03-30 ENCOUNTER — Other Ambulatory Visit: Payer: Self-pay

## 2020-03-30 ENCOUNTER — Ambulatory Visit (INDEPENDENT_AMBULATORY_CARE_PROVIDER_SITE_OTHER): Payer: Commercial Managed Care - PPO | Admitting: Physician Assistant

## 2020-03-30 ENCOUNTER — Encounter: Payer: Self-pay | Admitting: Physician Assistant

## 2020-03-30 DIAGNOSIS — F331 Major depressive disorder, recurrent, moderate: Secondary | ICD-10-CM

## 2020-03-30 DIAGNOSIS — F900 Attention-deficit hyperactivity disorder, predominantly inattentive type: Secondary | ICD-10-CM

## 2020-03-30 DIAGNOSIS — G4700 Insomnia, unspecified: Secondary | ICD-10-CM | POA: Diagnosis not present

## 2020-03-30 DIAGNOSIS — F410 Panic disorder [episodic paroxysmal anxiety] without agoraphobia: Secondary | ICD-10-CM

## 2020-03-30 MED ORDER — TRAZODONE HCL 100 MG PO TABS: 100.0000 mg | ORAL_TABLET | Freq: Every evening | ORAL | 1 refills | Status: DC | PRN

## 2020-03-30 MED ORDER — COTEMPLA XR-ODT 17.3 MG PO TBED: 17.3000 mg | EXTENDED_RELEASE_TABLET | Freq: Every morning | ORAL | 0 refills | Status: DC

## 2020-03-30 MED ORDER — DULOXETINE HCL 30 MG PO CPEP: ORAL_CAPSULE | ORAL | 1 refills | Status: DC

## 2020-03-30 NOTE — Progress Notes (Signed)
[facility]  Patient ID: Erin Sharp,  MRN: [id number]  PCP: Clayborn Heron, MD  Date of Evaluation: 03/30/2020 Time spent:30 minutes  Chief Complaint:  Chief Complaint    Depression; Anxiety      HISTORY/CURRENT STATUS: HPI Not doing well.  She d/c all meds around a month ago. At first, she just forgot to take them.  Then she thought 'they're not helping so why take them?' It's very hard to do anything d/t no motivation. No energy and stays tired.  No weight loss/gain. But appetite goes up and down. Cries easily. 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  Is drained after a shower though. Has passive SI but no plan. "I'm not going to do anything."   Not having generalized anxiety now but does get anxious when has to be around people. Will have a panic attack oftentimes when she has to show a house. Doesn't have any Xanax left.  Feels like the anxiety wasn't as bad when she took the Cotempla.   At the last visit we started Cotempla.  It did seem to help a little bit but not as much as she needed.  She still had trouble focusing and finishing tasks in a timely manner.  No side effects from it.  Sleep is better with trazodone, she is able to go to sleep better with it.  But she does sleep too much whether she takes it or not.  Patient denies increased energy with decreased need for sleep, no increased talkativeness, no racing thoughts, no impulsivity or risky behaviors, no increased spending, no increased libido, no grandiosity, no increased irritability or anger, and no hallucinations. No paranoia.   Denies dizziness, syncope, seizures, numbness, tingling, tremor, tics, unsteady gait, slurred speech, confusion. Denies muscle or joint pain,  stiffness, or dystonia.  Individual Medical History/ Review of Systems: Changes? :No    Past medications for mental health diagnoses include: Lexapro never helped,hydroxyzine is ineffective and cause drowsiness, Strattera caused nausea, Zoloft didn't help, Contempla, Xanax  Allergies: Patient has no known allergies.  Current Medications:  Current Outpatient Medications:  .  ALPRAZolam (XANAX) 0.25 MG tablet, Take 0.5-1 tablets (0.125-0.25 mg total) by mouth 2 (two) times daily as needed for anxiety. (Patient not taking: Reported on 03/30/2020), Disp: 20 tablet, Rfl: 0 .  cetirizine (ZYRTEC) 10 MG tablet, Take 10 mg by mouth daily., Disp: , Rfl:  .  cholecalciferol (VITAMIN D3) 25 MCG (1000 UT) tablet, Take 1,000 Units by mouth daily., Disp: , Rfl:  .  DULoxetine (CYMBALTA) 30 MG capsule, 1 po q am for 2 weeks, then 2 pills every morning., Disp: 60 capsule, Rfl: 1 .  Methylphenidate (COTEMPLA XR-ODT) 17.3 MG TBED, Take 17.3 mg by mouth in the morning., Disp: 30 tablet, Rfl: 0 .  MIGRELIEF 200-180-50 MG TABS, Take 2 tablets daily (Patient not taking: Reported on 06/27/2019), Disp: 60 tablet, Rfl: 5 .  norethindrone (MICRONOR) 0.35 MG tablet, , Disp: , Rfl:  .  sertraline (ZOLOFT) 100 MG tablet, TAKE 1 AND 1/2 TABLETS BY MOUTH EVERY DAY (Patient not taking: Reported on 03/30/2020), Disp: 135 tablet, Rfl: 0 .  traZODone (DESYREL) 100 MG tablet, Take 1 tablet (100 mg total) by mouth at bedtime as  needed for sleep., Disp: 30 tablet, Rfl: 1 .  zolmitriptan (ZOMIG) 5 MG nasal solution, 1 puff in nostril at onset of migraine, may repeat in 2 hours (Patient not taking: Reported on 03/30/2020), Disp: 6 Units, Rfl: 5 Medication Side Effects: none  Family Medical/ Social History: Changes? No  MENTAL HEALTH EXAM:  There were no vitals taken for this visit.There is no height or weight on file to calculate BMI.  General Appearance: Casual, Neat and Well Groomed  Eye Contact:  Good  Speech:  Clear and  Coherent and Normal Rate  Volume:  Normal  Mood:  Depressed  Affect:  Depressed  Thought Process:  Goal Directed and Descriptions of Associations: Intact  Orientation:  Full (Time, Place, and Person)  Thought Content: Logical   Suicidal Thoughts:  No  Homicidal Thoughts:  No  Memory:  WNL  Judgement:  Good  Insight:  Good  Psychomotor Activity:  Normal  Concentration:  Concentration: Fair and Attention Span: Fair  Recall:  Good  Fund of Knowledge: Good  Language: Good  Assets:  Desire for Improvement  ADL's:  Intact  Cognition: WNL  Prognosis:  Good    DIAGNOSES:    ICD-10-CM   1. Major depressive disorder, recurrent episode, moderate (HCC)  F33.1   2. Attention deficit hyperactivity disorder (ADHD), predominantly inattentive type  F90.0   3. Panic disorder  F41.0   4. Insomnia, unspecified type  G47.00     Receiving Psychotherapy: Yes  Sydnee Price   RECOMMENDATIONS:  PDMP reviewed. I spent 30 minutes with her. Its not good to go off medications without discussing that with me.  Some of the psychiatric medications can cause severe withdrawals, illness, or death.  She verbalizes understanding and states she will contact me next time if she has any concerns about the medication. She has taken 2 SSRIs already without as much improvement as we hoped.  I recommend we change to an SNRI.  We discussed the benefits, risks, side effects and she accepts. For the ADD, I recommend increasing the cotemplate dose. Discussed sleep hygiene.  Start Cymbalta 30 mg 1 p.o. every morning for 2 weeks, then increase to 2 p.o. every morning. Increase Cotempla XR 17.3 mg, 1 p.o. every morning. Increase trazodone to 100 mg 1 p.o. nightly as needed sleep. Continue therapy with [facility]. Return in 4 weeks.  Donnal Moat, PA-C

## 2020-03-31 ENCOUNTER — Encounter: Payer: Self-pay | Admitting: Physician Assistant

## 2020-04-24 ENCOUNTER — Encounter: Payer: Self-pay | Admitting: Physician Assistant

## 2020-04-24 ENCOUNTER — Other Ambulatory Visit: Payer: Self-pay

## 2020-04-24 ENCOUNTER — Ambulatory Visit (INDEPENDENT_AMBULATORY_CARE_PROVIDER_SITE_OTHER): Payer: Commercial Managed Care - PPO | Admitting: Physician Assistant

## 2020-04-24 DIAGNOSIS — G4700 Insomnia, unspecified: Secondary | ICD-10-CM | POA: Diagnosis not present

## 2020-04-24 DIAGNOSIS — F330 Major depressive disorder, recurrent, mild: Secondary | ICD-10-CM

## 2020-04-24 DIAGNOSIS — F900 Attention-deficit hyperactivity disorder, predominantly inattentive type: Secondary | ICD-10-CM | POA: Diagnosis not present

## 2020-04-24 DIAGNOSIS — F410 Panic disorder [episodic paroxysmal anxiety] without agoraphobia: Secondary | ICD-10-CM

## 2020-04-24 MED ORDER — COTEMPLA XR-ODT 25.9 MG PO TBED: 25.9000 mg | EXTENDED_RELEASE_TABLET | Freq: Every day | ORAL | 0 refills | Status: DC

## 2020-04-24 MED ORDER — TRAZODONE HCL 100 MG PO TABS: 100.0000 mg | ORAL_TABLET | Freq: Every evening | ORAL | 1 refills | Status: DC | PRN

## 2020-04-24 MED ORDER — DULOXETINE HCL 60 MG PO CPEP
60.0000 mg | ORAL_CAPSULE | Freq: Every day | ORAL | 0 refills | Status: DC
Start: 2020-04-24 — End: 2020-07-09

## 2020-04-24 NOTE — Progress Notes (Signed)
[facility]  Patient ID: Erin Sharp,  MRN: [id number]  PCP: Clayborn Heron, MD  Date of Evaluation: 03/30/2020 Time spent:20 minutes  Chief Complaint:  Chief Complaint    Follow-up      HISTORY/CURRENT STATUS: HPI For routine med check.   Feels much better since starting the Cymbalta.  Initially, she had a little stomach upset but that went away in about 4 days.  She is able to enjoy things, not crying easily, energy and motivation are good, not isolating.  No change in appetite.  Denies suicidal or homicidal thoughts.  She has planted a garden and getting out in the sun working with the vegetable plants has helped get her mind off things and also being in the sun is always good.  She is also going to the gym more often with a couple of friends.  It is good for her to see them and they all help each other accountable.    Continues to have trouble focusing and finishing things in a timely manner.  At the last visit we increased the Cotempla but she saw no change.  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  Works in Research [employer], [employer]. Sleeps well with the trazodone.  If she does not take it, she has trouble going to sleep and staying asleep.  Patient denies increased energy with decreased need for sleep, no increased talkativeness, no racing thoughts, no impulsivity or risky behaviors, no increased spending, no increased libido, no grandiosity, no increased irritability or anger, and no hallucinations. No paranoia.   Denies dizziness, syncope, seizures, numbness, tingling, tremor, tics, unsteady gait, slurred speech, confusion. Denies muscle or joint pain, stiffness, or dystonia.  Individual Medical History/ Review of Systems: Changes? :No    Past medications for mental health diagnoses include: Lexapro never helped,hydroxyzine is ineffective and cause drowsiness, Strattera caused nausea, Zoloft didn't help, Contempla,  Xanax  Allergies: Patient has no known allergies.  Current Medications:  Current Outpatient Medications:  .  cetirizine (ZYRTEC) 10 MG tablet, Take 10 mg by mouth daily., Disp: , Rfl:  .  cholecalciferol (VITAMIN D3) 25 MCG (1000 UT) tablet, Take 1,000 Units by mouth daily., Disp: , Rfl:  .  norethindrone (MICRONOR) 0.35 MG tablet, , Disp: , Rfl:  .  traZODone (DESYREL) 100 MG tablet, Take 1 tablet (100 mg total) by mouth at bedtime as needed for sleep., Disp: 30 tablet, Rfl: 1 .  DULoxetine (CYMBALTA) 60 MG capsule, Take 1 capsule (60 mg total) by mouth daily., Disp: 90 capsule, Rfl: 0 .  Methylphenidate (COTEMPLA XR-ODT) 25.9 MG TBED, Take 25.9 mg by mouth daily., Disp: 30 tablet, Rfl: 0 .  MIGRELIEF 200-180-50 MG TABS, Take 2 tablets daily (Patient not taking: Reported on 06/27/2019), Disp: 60 tablet, Rfl: 5 .  zolmitriptan (ZOMIG) 5 MG nasal solution, 1 puff in nostril at onset of migraine, may repeat in 2 hours (Patient not taking: Reported on 03/30/2020), Disp: 6 Units, Rfl: 5 Medication Side Effects: none  Family Medical/ Social History: Changes? No  MENTAL HEALTH EXAM:  There were no vitals taken for this visit.There is no height or weight on file to calculate BMI.  General Appearance: Casual, Neat and Well Groomed  Eye Contact:  Good  Speech:  Clear and Coherent and Normal Rate  Volume:  Normal  Mood:  Euthymic  Affect:  Appropriate  Thought Process:  Goal Directed and Descriptions of Associations: Intact  Orientation:  Full (Time,  Place, and Person)  Thought Content: Logical   Suicidal Thoughts:  No  Homicidal Thoughts:  No  Memory:  WNL  Judgement:  Good  Insight:  Good  Psychomotor Activity:  Normal  Concentration:  Concentration: Fair and Attention Span: Fair  Recall:  Good  Fund of Knowledge: Good  Language: Good  Assets:  Desire for Improvement  ADL's:  Intact  Cognition: WNL  Prognosis:  Good    DIAGNOSES:    ICD-10-CM   1. Attention deficit hyperactivity  disorder (ADHD), predominantly inattentive type  F90.0   2. Insomnia, unspecified type  G47.00   3. Panic disorder  F41.0   4. Mild episode of recurrent major depressive disorder (HCC)  F33.0     Receiving Psychotherapy: Yes  Areebah Price   RECOMMENDATIONS:  PDMP reviewed. I spent 20 minutes with her. I am glad to see her doing so much better!  I recommend increasing the Cotempla for the ADD, and continuing the same Cymbalta dose for now.  That may need to be increased at the next visit, but she is only been on the 60 mg for approximately 2 weeks.  She may get even more improvement in her anxiety and depression symptoms within a 2-week. Continue Cymbalta 60 mg, 1 p.o. every morning. Increase Cotempla XR to 25.9 mg, 1 p.o. every morning. Continue trazodone to 100 mg 1 p.o. nightly as needed sleep. Continue therapy with [facility]. Return in 4 weeks.  Donnal Moat, PA-C

## 2020-05-21 ENCOUNTER — Encounter: Payer: Self-pay | Admitting: Physician Assistant

## 2020-05-21 ENCOUNTER — Ambulatory Visit (INDEPENDENT_AMBULATORY_CARE_PROVIDER_SITE_OTHER): Payer: Commercial Managed Care - PPO | Admitting: Physician Assistant

## 2020-05-21 ENCOUNTER — Other Ambulatory Visit: Payer: Self-pay

## 2020-05-21 VITALS — BP 123/77 | HR 76

## 2020-05-21 DIAGNOSIS — F900 Attention-deficit hyperactivity disorder, predominantly inattentive type: Secondary | ICD-10-CM | POA: Diagnosis not present

## 2020-05-21 DIAGNOSIS — F3341 Major depressive disorder, recurrent, in partial remission: Secondary | ICD-10-CM | POA: Diagnosis not present

## 2020-05-21 DIAGNOSIS — G4700 Insomnia, unspecified: Secondary | ICD-10-CM

## 2020-05-21 MED ORDER — AMPHETAMINE-DEXTROAMPHET ER 30 MG PO CP24
30.0000 mg | ORAL_CAPSULE | Freq: Every day | ORAL | 0 refills | Status: DC
Start: 2020-05-21 — End: 2020-07-09

## 2020-05-21 NOTE — Progress Notes (Signed)
[facility]  Patient ID: Erin Sharp,  MRN: [id number]  PCP: Clayborn Heron, MD  Date of Evaluation: 03/30/2020 Time spent:20 minutes  Chief Complaint:  Chief Complaint    ADD; Insomnia; Depression      HISTORY/CURRENT STATUS: HPI For routine med check.   At LOV, we increased the Cotempla.  She has not felt any different at all.  He still has trouble concentrating and she gets distracted really easily.  At the visit before last, we had also increased the Cotempla, again without improvement.  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  Continues to do well with the Cymbalta.  She is able to enjoy things, not crying easily, energy and motivation are good, not isolating.  No change in appetite.  She is sleeping better but she does stay up later than she should because that is the time she has no interruptions, to work on her art and music.  Denies suicidal or homicidal thoughts.  Patient denies increased energy with decreased need for sleep, no increased talkativeness, no racing thoughts, no impulsivity or risky behaviors, no increased spending, no increased libido, no grandiosity, no increased irritability or anger, and no hallucinations. No paranoia.   Denies dizziness, syncope, seizures, numbness, tingling, tremor, tics, unsteady gait, slurred speech, confusion. Denies muscle or joint pain, stiffness, or dystonia.  Individual Medical History/ Review of Systems: Changes? :No    Past medications for mental health diagnoses include: Lexapro never helped,hydroxyzine is ineffective and cause drowsiness, Strattera caused nausea, Zoloft didn't help, Contempla was not effective up to 25.9 mg dose, Xanax  Allergies: Patient has no known allergies.  Current Medications:  Current Outpatient Medications:  .  cetirizine (ZYRTEC) 10 MG tablet, Take 10 mg by mouth daily., Disp: , Rfl:  .  cholecalciferol (VITAMIN D3) 25 MCG (1000 UT) tablet, Take 1,000  Units by mouth daily., Disp: , Rfl:  .  DULoxetine (CYMBALTA) 60 MG capsule, Take 1 capsule (60 mg total) by mouth daily., Disp: 90 capsule, Rfl: 0 .  Methylphenidate (COTEMPLA XR-ODT) 25.9 MG TBED, Take 25.9 mg by mouth daily., Disp: 30 tablet, Rfl: 0 .  norethindrone (MICRONOR) 0.35 MG tablet, , Disp: , Rfl:  .  traZODone (DESYREL) 100 MG tablet, Take 1 tablet (100 mg total) by mouth at bedtime as needed for sleep., Disp: 30 tablet, Rfl: 1 .  amphetamine-dextroamphetamine (ADDERALL XR) 30 MG 24 hr capsule, Take 1 capsule (30 mg total) by mouth daily., Disp: 30 capsule, Rfl: 0 .  zolmitriptan (ZOMIG) 5 MG nasal solution, 1 puff in nostril at onset of migraine, may repeat in 2 hours (Patient not taking: Reported on 03/30/2020), Disp: 6 Units, Rfl: 5 Medication Side Effects: none  Family Medical/ Social History: Changes? No  MENTAL HEALTH EXAM:  Blood pressure 123/77, pulse 76.There is no height or weight on file to calculate BMI.  General Appearance: Casual, Neat and Well Groomed  Eye Contact:  Good  Speech:  Clear and Coherent and Normal Rate  Volume:  Normal  Mood:  Euthymic  Affect:  Appropriate  Thought Process:  Goal Directed and Descriptions of Associations: Intact  Orientation:  Full (Time, Place, and Person)  Thought Content: Logical   Suicidal Thoughts:  No  Homicidal Thoughts:  No  Memory:  WNL  Judgement:  Good  Insight:  Good  Psychomotor Activity:  Normal  Concentration:  Concentration: Fair and Attention Span: Fair  Recall:  Good  Fund of Knowledge: Good  Language: Good  Assets:  Desire for Improvement  ADL's:  Intact  Cognition: WNL  Prognosis:  Good    DIAGNOSES:    ICD-10-CM   1. Attention deficit hyperactivity disorder (ADHD), predominantly inattentive type  F90.0   2. Recurrent major depressive disorder, in partial remission ([place])  F33.41   3. Insomnia, unspecified type  G47.00     Receiving Psychotherapy: Yes  Kimberlynn Price   RECOMMENDATIONS:  PDMP  reviewed. I provided 20 minutes of face-to-face time during this encounter. Since she does not seem to be responding at all after several dose increases on the Cotempla, I see that as a treatment failure.  I recommend that we change to an amphetamine.  We discussed the benefits, risks and side effects.  Her brother is on Adderall and has responded well.  He was just recently diagnosed and that has been his initial treatment. Start Adderall XR  30 mg, 1 p.o. every morning.  She will call in about 2 weeks, if she is not responding well to this dose.  Will leave a detailed message letting me know if it is not strong enough, or it does not last long enough.  I can send in a short acting dose if needed.  Hopefully she will respond well to this and not need any added dosing. Continue Cymbalta 60 mg, 1 p.o. every morning. Continue trazodone to 100 mg 1 p.o. nightly as needed sleep. Continue therapy with [facility]. Return in 4 weeks.  Donnal Moat, PA-C

## 2020-05-27 ENCOUNTER — Telehealth: Payer: Self-pay

## 2020-05-27 NOTE — Telephone Encounter (Signed)
Prior authorization submitted and approved for ADDERALL XR 30 MG CAPSULES with [facility] effective 05/21/2020-05/21/2021

## 2020-06-09 ENCOUNTER — Telehealth: Payer: Self-pay | Admitting: Physician Assistant

## 2020-06-09 NOTE — Telephone Encounter (Signed)
Pt stated per conversation with T Hurst she would like to have the 2nd Adderall pill added to her medication regimen.

## 2020-06-11 ENCOUNTER — Other Ambulatory Visit: Payer: Self-pay | Admitting: Physician Assistant

## 2020-06-11 MED ORDER — AMPHETAMINE-DEXTROAMPHETAMINE 10 MG PO TABS
10.0000 mg | ORAL_TABLET | Freq: Every day | ORAL | 0 refills | Status: DC
Start: 2020-06-11 — End: 2020-07-09

## 2020-06-11 NOTE — Telephone Encounter (Signed)
Prescription was sent for Adderall 10 mg.  She should take it around lunchtime or mid afternoon as needed.  I do not recommend she take it after 3 PM or if they keep her awake.  Continue the morning dose of Adderall.

## 2020-06-11 NOTE — Telephone Encounter (Signed)
Patient aware of instructions and had no questions or concerns at this time.

## 2020-06-18 ENCOUNTER — Ambulatory Visit: Payer: Commercial Managed Care - PPO | Admitting: Physician Assistant

## 2020-07-04 ENCOUNTER — Other Ambulatory Visit: Payer: Self-pay | Admitting: Physician Assistant

## 2020-07-09 ENCOUNTER — Telehealth: Payer: Self-pay | Admitting: Physician Assistant

## 2020-07-09 ENCOUNTER — Other Ambulatory Visit: Payer: Self-pay

## 2020-07-09 MED ORDER — DULOXETINE HCL 60 MG PO CPEP: 60.0000 mg | ORAL_CAPSULE | Freq: Every day | ORAL | 0 refills | Status: DC

## 2020-07-09 MED ORDER — AMPHETAMINE-DEXTROAMPHETAMINE 10 MG PO TABS: 10.0000 mg | ORAL_TABLET | Freq: Every day | ORAL | 0 refills | Status: DC

## 2020-07-09 MED ORDER — AMPHETAMINE-DEXTROAMPHET ER 30 MG PO CP24: 30.0000 mg | ORAL_CAPSULE | Freq: Every day | ORAL | 0 refills | Status: DC

## 2020-07-09 NOTE — Telephone Encounter (Signed)
Pt requesting refill for Adderall XR 30 mg and Adderall 10 mg also, Cymbalta @ [facility] [address]

## 2020-07-09 NOTE — Telephone Encounter (Signed)
Last refill 10 mg Adderall 06/11/2020 Last refill XR 30 mg Adderall 05/21/2020  Pended both for Rosey Bath to send with Duloxetine [facility] apt 07/28/20

## 2020-07-28 ENCOUNTER — Encounter: Payer: Self-pay | Admitting: Physician Assistant

## 2020-07-28 ENCOUNTER — Ambulatory Visit (INDEPENDENT_AMBULATORY_CARE_PROVIDER_SITE_OTHER): Payer: Commercial Managed Care - PPO | Admitting: Physician Assistant

## 2020-07-28 ENCOUNTER — Other Ambulatory Visit: Payer: Self-pay

## 2020-07-28 VITALS — BP 124/89 | HR 73

## 2020-07-28 DIAGNOSIS — F900 Attention-deficit hyperactivity disorder, predominantly inattentive type: Secondary | ICD-10-CM

## 2020-07-28 DIAGNOSIS — F3341 Major depressive disorder, recurrent, in partial remission: Secondary | ICD-10-CM | POA: Diagnosis not present

## 2020-07-28 DIAGNOSIS — F410 Panic disorder [episodic paroxysmal anxiety] without agoraphobia: Secondary | ICD-10-CM | POA: Diagnosis not present

## 2020-07-28 DIAGNOSIS — G4700 Insomnia, unspecified: Secondary | ICD-10-CM

## 2020-07-28 MED ORDER — AMPHETAMINE-DEXTROAMPHETAMINE 10 MG PO TABS: 10.0000 mg | ORAL_TABLET | Freq: Every day | ORAL | 0 refills | Status: DC

## 2020-07-28 MED ORDER — AMPHETAMINE-DEXTROAMPHET ER 30 MG PO CP24: 30.0000 mg | ORAL_CAPSULE | Freq: Every day | ORAL | 0 refills | Status: DC

## 2020-07-28 NOTE — Progress Notes (Signed)
[facility]  Patient ID: Erin Sharp,  MRN: [id number]  PCP: Clayborn Heron, MD  Date of Evaluation: 07/28/2020 Time spent:20 minutes  Chief Complaint:  Chief Complaint    ADD; Depression; Insomnia      HISTORY/CURRENT STATUS: For routine med check.   She is doing well since we started the Adderall XR at the last visit.  It is working much better than the Cotempla did.  She does not feel like her mind is going in 12,000 different directions.  She is able to enjoy things, not crying easily, energy and motivation are good, not isolating.  No change in appetite.  Not isolating.  Not crying easily.  Sleeps well with the trazodone.  Denies suicidal or homicidal thoughts.  Patient denies increased energy with decreased need for sleep, no increased talkativeness, no racing thoughts, no impulsivity or risky behaviors, no increased spending, no increased libido, no grandiosity, no increased irritability or anger, and no hallucinations. No paranoia.   Denies dizziness, syncope, seizures, numbness, tingling, tremor, tics, unsteady gait, slurred speech, confusion. Denies muscle or joint pain, stiffness, or dystonia.  Individual Medical History/ Review of Systems: Changes? :No    Past medications for mental health diagnoses include: Lexapro never helped,hydroxyzine is ineffective and cause drowsiness, Strattera caused nausea, Zoloft didn't help, Contempla was not effective up to 25.9 mg dose, Xanax  Allergies: Patient has no known allergies.  Current Medications:  Current Outpatient Medications:  .  [START ON 10/06/2020] amphetamine-dextroamphetamine (ADDERALL XR) 30 MG 24 hr capsule, Take 1 capsule (30 mg total) by mouth daily., Disp: 30 capsule, Rfl: 0 .  [START ON 09/25/2020] amphetamine-dextroamphetamine (ADDERALL) 10 MG tablet, Take 1 tablet (10 mg total) by mouth daily with lunch., Disp: 30 tablet, Rfl: 0 .  cetirizine (ZYRTEC) 10 MG tablet, Take 10 mg by mouth  daily., Disp: , Rfl:  .  cholecalciferol (VITAMIN D3) 25 MCG (1000 UT) tablet, Take 1,000 Units by mouth daily., Disp: , Rfl:  .  DULoxetine (CYMBALTA) 60 MG capsule, Take 1 capsule (60 mg total) by mouth daily., Disp: 90 capsule, Rfl: 0 .  norethindrone (MICRONOR) 0.35 MG tablet, , Disp: , Rfl:  .  traZODone (DESYREL) 100 MG tablet, TAKE 1 TABLET (100 MG TOTAL) BY MOUTH AT BEDTIME AS NEEDED FOR SLEEP., Disp: 90 tablet, Rfl: 0 .  [START ON 09/06/2020] amphetamine-dextroamphetamine (ADDERALL XR) 30 MG 24 hr capsule, Take 1 capsule (30 mg total) by mouth daily., Disp: 30 capsule, Rfl: 0 .  [START ON 08/08/2020] amphetamine-dextroamphetamine (ADDERALL XR) 30 MG 24 hr capsule, Take 1 capsule (30 mg total) by mouth daily., Disp: 30 capsule, Rfl: 0 .  [START ON 08/27/2020] amphetamine-dextroamphetamine (ADDERALL) 10 MG tablet, Take 1 tablet (10 mg total) by mouth daily with lunch., Disp: 30 tablet, Rfl: 0 .  amphetamine-dextroamphetamine (ADDERALL) 10 MG tablet, Take 1 tablet (10 mg total) by mouth daily with lunch., Disp: 30 tablet, Rfl: 0 .  zolmitriptan (ZOMIG) 5 MG nasal solution, 1 puff in nostril at onset of migraine, may repeat in 2 hours (Patient not taking: Reported on 03/30/2020), Disp: 6 Units, Rfl: 5 Medication Side Effects: none  Family Medical/ Social History: Changes? No  MENTAL HEALTH EXAM:  Blood pressure 124/89, pulse 73.There is no height or weight on file to calculate BMI.  General Appearance: Casual, Neat and Well Groomed  Eye Contact:  Good  Speech:  Clear and Coherent and Normal Rate  Volume:  Normal  Mood:  Euthymic  Affect:  Appropriate  Thought Process:  Goal Directed and Descriptions of Associations: Intact  Orientation:  Full (Time, Place, and Person)  Thought Content: Logical   Suicidal Thoughts:  No  Homicidal Thoughts:  No  Memory:  WNL  Judgement:  Good  Insight:  Good  Psychomotor Activity:  Normal  Concentration:  Concentration: Good and Attention Span: Good   Recall:  Good  Fund of Knowledge: Good  Language: Good  Assets:  Desire for Improvement  ADL's:  Intact  Cognition: WNL  Prognosis:  Good    DIAGNOSES:    ICD-10-CM   1. Attention deficit hyperactivity disorder (ADHD), predominantly inattentive type  F90.0   2. Recurrent major depressive disorder, in partial remission (HCC)  F33.41   3. Insomnia, unspecified type  G47.00   4. Panic disorder  F41.0     Receiving Psychotherapy: Yes  Mae Price    RECOMMENDATIONS:  PDMP reviewed. I provided 20 minutes of face-to-face time during this encounter. I am glad to see her doing so much better. Continue Adderall XR  30 mg, 1 p.o. every morning.  Continue Cymbalta 60 mg, 1 p.o. every morning. Continue trazodone to 100 mg 1 p.o. nightly as needed sleep. Continue therapy with [facility]. Return in 3 months.  Melony Overly, PA-C

## 2020-10-02 ENCOUNTER — Other Ambulatory Visit: Payer: Self-pay | Admitting: Physician Assistant

## 2020-10-20 ENCOUNTER — Telehealth: Payer: Self-pay | Admitting: Physician Assistant

## 2020-10-20 NOTE — Telephone Encounter (Signed)
Pt LM on VM stating she is starting community college and requesting r IEP letter and ESA for her dog. Any questions with these request contact pt @ [phone]. Apt 11/29

## 2020-10-20 NOTE — Telephone Encounter (Signed)
We will discuss this at her appointment next week

## 2020-10-26 ENCOUNTER — Ambulatory Visit: Payer: Commercial Managed Care - PPO | Admitting: Physician Assistant

## 2020-11-25 ENCOUNTER — Other Ambulatory Visit: Payer: Commercial Managed Care - PPO

## 2020-11-26 ENCOUNTER — Other Ambulatory Visit: Payer: Commercial Managed Care - PPO

## 2020-11-26 DIAGNOSIS — Z20822 Contact with and (suspected) exposure to covid-19: Secondary | ICD-10-CM

## 2020-11-28 LAB — NOVEL CORONAVIRUS, NAA: SARS-CoV-2, NAA: NOT DETECTED

## 2020-11-28 LAB — SARS-COV-2, NAA 2 DAY TAT

## 2020-12-08 ENCOUNTER — Other Ambulatory Visit: Payer: Self-pay | Admitting: Physician Assistant

## 2020-12-08 ENCOUNTER — Telehealth: Payer: Self-pay | Admitting: Physician Assistant

## 2020-12-08 MED ORDER — DULOXETINE HCL 60 MG PO CPEP: 60.0000 mg | ORAL_CAPSULE | Freq: Every day | ORAL | 0 refills | Status: DC

## 2020-12-08 MED ORDER — AMPHETAMINE-DEXTROAMPHET ER 30 MG PO CP24: 30.0000 mg | ORAL_CAPSULE | Freq: Every day | ORAL | 0 refills | Status: DC

## 2020-12-08 MED ORDER — AMPHETAMINE-DEXTROAMPHETAMINE 10 MG PO TABS: 10.0000 mg | ORAL_TABLET | Freq: Every day | ORAL | 0 refills | Status: DC

## 2020-12-08 NOTE — Telephone Encounter (Signed)
Prescriptions were sent in as noted on her medication list

## 2020-12-08 NOTE — Telephone Encounter (Signed)
Pt called to schedule apt. Requesting refills on Adderall  XR 30 mg and 20 mg and Cymbalta @ [facility] [address]  Pt ask about getting a letter for school stating diagnoses. Apt 3/2 and on canc list

## 2020-12-08 NOTE — Telephone Encounter (Signed)
Please check and see where she wants this letter faxed and who were supposed to fax it to.  Thank you

## 2020-12-30 ENCOUNTER — Encounter: Payer: Self-pay | Admitting: Physician Assistant

## 2020-12-30 ENCOUNTER — Ambulatory Visit (INDEPENDENT_AMBULATORY_CARE_PROVIDER_SITE_OTHER): Payer: Commercial Managed Care - PPO | Admitting: Physician Assistant

## 2020-12-30 ENCOUNTER — Other Ambulatory Visit: Payer: Self-pay

## 2020-12-30 VITALS — BP 127/77 | HR 95 | Wt 191.0 lb

## 2020-12-30 DIAGNOSIS — F330 Major depressive disorder, recurrent, mild: Secondary | ICD-10-CM

## 2020-12-30 DIAGNOSIS — F900 Attention-deficit hyperactivity disorder, predominantly inattentive type: Secondary | ICD-10-CM

## 2020-12-30 DIAGNOSIS — G4700 Insomnia, unspecified: Secondary | ICD-10-CM | POA: Diagnosis not present

## 2020-12-30 MED ORDER — AMPHETAMINE-DEXTROAMPHET ER 30 MG PO CP24: 30.0000 mg | ORAL_CAPSULE | Freq: Two times a day (BID) | ORAL | 0 refills | Status: DC

## 2020-12-30 MED ORDER — TRAZODONE HCL 100 MG PO TABS
100.0000 mg | ORAL_TABLET | Freq: Every evening | ORAL | 3 refills | Status: DC | PRN
Start: 2020-12-30 — End: 2021-11-12

## 2020-12-30 NOTE — Progress Notes (Signed)
[facility]  Patient ID: NDEA KILROY,  MRN: [id number]  PCP: Clayborn Heron, MD  Date of Evaluation: 12/30/2020 Time spent:30 minutes  Chief Complaint:  Chief Complaint    Depression; ADD      HISTORY/CURRENT STATUS: For routine med check.   Here for 74-month med check.  She has started taking classes at [employer] and has found that the Adderall is not effective enough.  The extended release 30 mg morning dose seems to only last about 3 to 4 hours and the 10 mg instant release only last maybe 2 or so at the most.  She has trouble getting distracted very easily and is unable to finish things in a timely manner.  She request a note be written for special accommodations at school for testing.  States for a month or more, she has been feeling more down.  Not wanting to do things with her friends like she has.  Not really enjoying much.  Energy and especially motivation is very low.  She is not crying easily.  Appetite is normal and weight is stable. Sleeps ok with the Trazodone.  No suicidal or homicidal thoughts.  Patient denies increased energy with decreased need for sleep, no increased talkativeness, no racing thoughts, no impulsivity or risky behaviors, no increased spending, no increased libido, no grandiosity, no increased irritability or anger, no paranoia, and no hallucinations.  Denies dizziness, syncope, seizures, numbness, tingling, tremor, tics, unsteady gait, slurred speech, confusion. Denies muscle or joint pain, stiffness, or dystonia.  Individual Medical History/ Review of Systems: Changes? :No    Past medications for mental health diagnoses include: Lexapro never helped,hydroxyzine is ineffective and cause drowsiness, Strattera caused nausea, Zoloft didn't help, Contempla was not effective up to 25.9 mg dose, Xanax  Allergies: Patient has no known allergies.  Current Medications:  Current Outpatient Medications:  .  cetirizine (ZYRTEC) 10 MG tablet,  Take 10 mg by mouth daily., Disp: , Rfl:  .  cholecalciferol (VITAMIN D3) 25 MCG (1000 UT) tablet, Take 1,000 Units by mouth daily., Disp: , Rfl:  .  DULoxetine (CYMBALTA) 60 MG capsule, Take 1 capsule (60 mg total) by mouth daily., Disp: 90 capsule, Rfl: 0 .  amphetamine-dextroamphetamine (ADDERALL XR) 30 MG 24 hr capsule, Take 1 capsule (30 mg total) by mouth 2 (two) times daily with breakfast and lunch., Disp: 60 capsule, Rfl: 0 .  norethindrone (MICRONOR) 0.35 MG tablet, , Disp: , Rfl:  .  traZODone (DESYREL) 100 MG tablet, Take 1 tablet (100 mg total) by mouth at bedtime as needed for sleep., Disp: 90 tablet, Rfl: 3 .  zolmitriptan (ZOMIG) 5 MG nasal solution, 1 puff in nostril at onset of migraine, may repeat in 2 hours (Patient not taking: No sig reported), Disp: 6 Units, Rfl: 5 Medication Side Effects: none  Family Medical/ Social History: Changes? Started classes at [employer]  MENTAL HEALTH EXAM:  Blood pressure 127/77, pulse 95, weight 191 lb (86.6 kg).Body mass index is 30.14 kg/m.  General Appearance: Casual, Neat and Well Groomed  Eye Contact:  Good  Speech:  Clear and Coherent and Normal Rate  Volume:  Normal  Mood:  Euthymic  Affect:  Appropriate  Thought Process:  Goal Directed and Descriptions of Associations: Intact  Orientation:  Full (Time, Place, and Person)  Thought Content: Logical   Suicidal Thoughts:  No  Homicidal Thoughts:  No  Memory:  WNL  Judgement:  Good  Insight:  Good  Psychomotor Activity:  Normal  Concentration:  Concentration: Good and Attention Span: Fair  Recall:  Good  Fund of Knowledge: Good  Language: Good  Assets:  Desire for Improvement  ADL's:  Intact  Cognition: WNL  Prognosis:  Good    DIAGNOSES:    ICD-10-CM   1. Attention deficit hyperactivity disorder (ADHD), predominantly inattentive type  F90.0   2. Mild episode of recurrent major depressive disorder (HCC)  F33.0   3. Insomnia, unspecified type  G47.00     Receiving  Psychotherapy: Yes  Leshawn Price    RECOMMENDATIONS:  PDMP reviewed. I provided 40 minutes of face-to-face time during this encounter, including time spent before and after the visit reviewing records.  During the visit we discussed the ADHD and the need for longer duration of action the stimulant.  There are several different things we could do to address this, my first thought is to increase the total daily dose of Adderall XR.  She would like to try this before changing medications, which would be another recommendation. I did write out a note on a prescription pad with her name and date of birth stating that "she has ADHD and requires an increased time for test and be in a quiet location.  Her diagnosis is ADHD dominantly inattentive type." For the symptoms of worsening depression, we did discuss the possibility of increasing the Cymbalta.  But I would prefer not to do 2 things at once, and there is a possibility that increasing the Adderall will also help her mood, energy and motivation.  If it is not improved within about 2 weeks, she can call and we will increase Cymbalta to 90 mg p.o. daily.  She understands. Continue Adderall XR  30 mg, 1 with breakfast and 1 with lunch.  Continue Cymbalta 60 mg, 1 p.o. every morning. Continue trazodone to 100 mg 1 p.o. nightly as needed sleep. Continue therapy with [facility]. Return in 4 to 6 weeks  Melony Overly, PA-C

## 2021-01-27 ENCOUNTER — Ambulatory Visit: Payer: Commercial Managed Care - PPO | Admitting: Physician Assistant

## 2021-02-19 ENCOUNTER — Ambulatory Visit: Payer: Commercial Managed Care - PPO | Admitting: Physician Assistant

## 2021-03-17 ENCOUNTER — Other Ambulatory Visit: Payer: Self-pay | Admitting: Physician Assistant

## 2021-04-01 ENCOUNTER — Encounter (INDEPENDENT_AMBULATORY_CARE_PROVIDER_SITE_OTHER): Payer: Self-pay

## 2021-04-19 ENCOUNTER — Encounter: Payer: Self-pay | Admitting: Physician Assistant

## 2021-04-19 ENCOUNTER — Other Ambulatory Visit: Payer: Self-pay

## 2021-04-19 ENCOUNTER — Ambulatory Visit (INDEPENDENT_AMBULATORY_CARE_PROVIDER_SITE_OTHER): Payer: Commercial Managed Care - PPO | Admitting: Physician Assistant

## 2021-04-19 VITALS — Wt 189.0 lb

## 2021-04-19 DIAGNOSIS — F410 Panic disorder [episodic paroxysmal anxiety] without agoraphobia: Secondary | ICD-10-CM

## 2021-04-19 DIAGNOSIS — G4700 Insomnia, unspecified: Secondary | ICD-10-CM | POA: Diagnosis not present

## 2021-04-19 DIAGNOSIS — F900 Attention-deficit hyperactivity disorder, predominantly inattentive type: Secondary | ICD-10-CM

## 2021-04-19 DIAGNOSIS — F331 Major depressive disorder, recurrent, moderate: Secondary | ICD-10-CM

## 2021-04-19 MED ORDER — AMPHETAMINE-DEXTROAMPHET ER 15 MG PO CP24: 15.0000 mg | ORAL_CAPSULE | Freq: Two times a day (BID) | ORAL | 0 refills | Status: DC

## 2021-04-19 MED ORDER — DULOXETINE HCL 60 MG PO CPEP: ORAL_CAPSULE | ORAL | 1 refills | Status: DC

## 2021-04-19 MED ORDER — HYDROXYZINE HCL 10 MG PO TABS: 5.0000 mg | ORAL_TABLET | Freq: Three times a day (TID) | ORAL | 0 refills | Status: DC | PRN

## 2021-04-19 MED ORDER — DULOXETINE HCL 30 MG PO CPEP: 30.0000 mg | ORAL_CAPSULE | Freq: Every day | ORAL | 0 refills | Status: DC

## 2021-04-19 NOTE — Progress Notes (Signed)
[facility]  Patient ID: Erin Sharp,  MRN: [id number]  PCP: Clayborn Heron, MD  Date of Evaluation: 04/19/2021 Time spent:30 minutes  Chief Complaint:  Chief Complaint    Depression; ADD      HISTORY/CURRENT STATUS: HPI For routine med check.   Had a bad breakup not long ago, and quit taking all her meds.  Didn't think they were working anymore. Now knows she felt better on them. Has been off them for about a month.  She did not have any withdrawals with coming off the Cymbalta cold [place].  She had missed a lot of work due to the depression and anxiety.  She is having anxiety attacks more often like she used to.  They can come out of nowhere.  She will get short of breath, feel shaky inside, and has a sense of something bad about to happen.  Feels depressed, a lot of sadness, not wanting to do things that she normally enjoyed.  Energy and motivation have been low, cries easily, denies suicidal or homicidal thoughts.  Has not been able to focus well since being off the Adderall.  It has really affected her work.  She works in [employer], [employer] and is with a good group of people who understand and help her out a lot or else "I probably would have lost my job."  Denies dizziness, syncope, seizures, numbness, tingling, tremor, tics, unsteady gait, slurred speech, confusion. Denies muscle or joint pain, stiffness, or dystonia.  Individual Medical History/ Review of Systems: Changes? :No   Past medications for mental health diagnoses include: Lexapro never helped,hydroxyzine is ineffective and cause drowsiness, Strattera caused nausea, Zoloft didn't help, Contempla was not effective up to 25.9 mg dose, Xanax  Allergies: Patient has no known allergies.  Current Medications:  Current Outpatient Medications:  .  amphetamine-dextroamphetamine (ADDERALL XR) 15 MG 24 hr capsule, Take 1 capsule by mouth 2 (two) times daily with breakfast and lunch., Disp: 60 capsule, Rfl:  0 .  DULoxetine (CYMBALTA) 30 MG capsule, Take 1 capsule (30 mg total) by mouth daily., Disp: 14 capsule, Rfl: 0 .  hydrOXYzine (ATARAX/VISTARIL) 10 MG tablet, Take 0.5-2 tablets (5-20 mg total) by mouth 3 (three) times daily as needed., Disp: 60 tablet, Rfl: 0 .  traZODone (DESYREL) 100 MG tablet, Take 1 tablet (100 mg total) by mouth at bedtime as needed for sleep., Disp: 90 tablet, Rfl: 3 .  cetirizine (ZYRTEC) 10 MG tablet, Take 10 mg by mouth daily. (Patient not taking: Reported on 04/19/2021), Disp: , Rfl:  .  cholecalciferol (VITAMIN D3) 25 MCG (1000 UT) tablet, Take 1,000 Units by mouth daily. (Patient not taking: Reported on 04/19/2021), Disp: , Rfl:  .  DULoxetine (CYMBALTA) 60 MG capsule, 1 po qd, begin after 2 weeks on the 30 mg., Disp: 30 capsule, Rfl: 1 .  norethindrone (MICRONOR) 0.35 MG tablet, , Disp: , Rfl:  .  zolmitriptan (ZOMIG) 5 MG nasal solution, 1 puff in nostril at onset of migraine, may repeat in 2 hours (Patient not taking: No sig reported), Disp: 6 Units, Rfl: 5 Medication Side Effects: none  Family Medical/ Social History: Changes? No  MENTAL HEALTH EXAM:  Weight 189 lb (85.7 kg).Body mass index is 29.82 kg/m.  General Appearance: Casual and Well Groomed  Eye Contact:  Good  Speech:  Clear and Coherent and Normal Rate  Volume:  Normal  Mood:  Anxious and Depressed  Affect:  Depressed and Anxious  Thought Process:  Goal Directed  and Descriptions of Associations: Circumstantial  Orientation:  Full (Time, Place, and Person)  Thought Content: Logical   Suicidal Thoughts:  No  Homicidal Thoughts:  No  Memory:  WNL  Judgement:  Good  Insight:  Good  Psychomotor Activity:  Normal  Concentration:  Concentration: Fair and Attention Span: Fair  Recall:  Good  Fund of Knowledge: Good  Language: Good  Assets:  Desire for Improvement  ADL's:  Intact  Cognition: WNL  Prognosis:  Good    DIAGNOSES:    ICD-10-CM   1. Major depressive disorder, recurrent  episode, moderate (HCC)  F33.1   2. Attention deficit hyperactivity disorder (ADHD), predominantly inattentive type  F90.0   3. Insomnia, unspecified type  G47.00   4. Panic disorder  F41.0      Receiving Psychotherapy: No    RECOMMENDATIONS:  PDMP was reviewed. I provided 30 minutes of face-to-face time during this encounter, including time spent before and after the visit, in review of records, medical decision making, and charting. We discussed stopping her medications.  I asked her to please call our office if she has concerns in the future and do not just stop the medication without instructions. Because she has been off of the Adderall for over a month, I recommend that we do not start at the high dose again but will still prescribe it for twice daily.  She may only need the dose at breakfast.  She can try that first and if that is enough then do not take the afternoon dose.  She verbalizes understanding. Will restart Cymbalta, and will ramp up on that as well. She is having trouble sleeping so I recommend she increase the trazodone up to 200 mg.  She can also take hydroxyzine with it if needed.  If after a few days the trazodone is not effective, she can call or send a message through MyChart and I will send in a prescription for mirtazapine. Restart Adderall XR 15 mg, 1 p.o. at breakfast and 1 at lunch. Restart Cymbalta 30 mg, 1 p.o. daily for 2 weeks and then increase to 60 mg daily. Restart hydroxyzine 10 mg, 1/2 to 2 pills 3 times daily as needed. Continue trazodone 100 mg but can increase up to a total of 2 pills. Return in 2 months.  Melony Overly, PA-C

## 2021-06-14 ENCOUNTER — Telehealth: Payer: Self-pay | Admitting: Physician Assistant

## 2021-06-14 ENCOUNTER — Other Ambulatory Visit: Payer: Self-pay | Admitting: Physician Assistant

## 2021-06-14 MED ORDER — DULOXETINE HCL 60 MG PO CPEP: 60.0000 mg | ORAL_CAPSULE | Freq: Every day | ORAL | 1 refills | Status: DC

## 2021-06-14 MED ORDER — HYDROXYZINE HCL 10 MG PO TABS: 5.0000 mg | ORAL_TABLET | Freq: Three times a day (TID) | ORAL | 0 refills | Status: DC | PRN

## 2021-06-14 MED ORDER — AMPHETAMINE-DEXTROAMPHET ER 20 MG PO CP24: 20.0000 mg | ORAL_CAPSULE | Freq: Two times a day (BID) | ORAL | 0 refills | Status: DC

## 2021-06-14 NOTE — Telephone Encounter (Signed)
Let her know that I sent in a prescription on the Adderall XR 20 mg and would like her to take 1 in the morning and 1 around lunchtime.  My reasoning behind that is that I do not want to double the morning dose, that would be too much at 1 time.  And if I only did the Adderall XR 20 mg in the morning and 15 in the afternoon she would have to pay 2 co-pays.  So let us try this first.  Thanks.

## 2021-06-14 NOTE — Telephone Encounter (Signed)
Please review

## 2021-06-14 NOTE — Telephone Encounter (Signed)
Pt would like a refill dulextine 60mg , and hydroxyzine 10mg , adderall xr 15mg  . Pt would like to change rx for adderall to 30mg  in the Am, and 15mg  at lunch. [facility] send to [facility] [address]

## 2021-06-15 ENCOUNTER — Ambulatory Visit: Payer: Commercial Managed Care - PPO | Admitting: Physician Assistant

## 2021-06-15 NOTE — Telephone Encounter (Signed)
LVM to return call.

## 2021-06-15 NOTE — Telephone Encounter (Signed)
Pt informed

## 2021-06-21 ENCOUNTER — Telehealth: Payer: Self-pay | Admitting: Physician Assistant

## 2021-06-23 NOTE — Telephone Encounter (Signed)
Prior Authorization submitted and approved for ADDERALL XR 20 MG #60 effective 06/21/2021-06/21/2022 with [facility]

## 2021-07-24 ENCOUNTER — Other Ambulatory Visit: Payer: Self-pay | Admitting: Physician Assistant

## 2021-08-04 ENCOUNTER — Encounter: Payer: Self-pay | Admitting: Physician Assistant

## 2021-08-04 ENCOUNTER — Other Ambulatory Visit: Payer: Self-pay

## 2021-08-04 ENCOUNTER — Ambulatory Visit (INDEPENDENT_AMBULATORY_CARE_PROVIDER_SITE_OTHER): Payer: Commercial Managed Care - PPO | Admitting: Physician Assistant

## 2021-08-04 DIAGNOSIS — F410 Panic disorder [episodic paroxysmal anxiety] without agoraphobia: Secondary | ICD-10-CM | POA: Diagnosis not present

## 2021-08-04 DIAGNOSIS — F331 Major depressive disorder, recurrent, moderate: Secondary | ICD-10-CM | POA: Diagnosis not present

## 2021-08-04 DIAGNOSIS — F900 Attention-deficit hyperactivity disorder, predominantly inattentive type: Secondary | ICD-10-CM | POA: Diagnosis not present

## 2021-08-04 DIAGNOSIS — F32A Depression, unspecified: Secondary | ICD-10-CM

## 2021-08-04 DIAGNOSIS — G4700 Insomnia, unspecified: Secondary | ICD-10-CM | POA: Diagnosis not present

## 2021-08-04 MED ORDER — AMPHETAMINE-DEXTROAMPHET ER 20 MG PO CP24: 20.0000 mg | ORAL_CAPSULE | Freq: Two times a day (BID) | ORAL | 0 refills | Status: DC

## 2021-08-04 MED ORDER — HYDROXYZINE HCL 10 MG PO TABS: 5.0000 mg | ORAL_TABLET | Freq: Three times a day (TID) | ORAL | 5 refills | Status: DC | PRN

## 2021-08-04 MED ORDER — LITHIUM CARBONATE 150 MG PO CAPS: ORAL_CAPSULE | ORAL | 1 refills | Status: DC

## 2021-08-04 NOTE — Progress Notes (Signed)
[facility]  Patient ID: KUULEI KLEIER,  MRN: [id number]  PCP: Clayborn Heron, MD  Date of Evaluation: 08/04/2021 Time spent:40 minutes  Chief Complaint:  Chief Complaint   Follow-up; Depression     HISTORY/CURRENT STATUS: HPI For routine med check.   At the last visit they restarted Adderall XR, Cymbalta, and hydroxyzine.  Patient states she is feeling better, at least thinking about wanting to do things whereas before restarting these medications she did not.  States she has come to terms with the fact that she will not ever be happy or excited about things like she should for whatever the circumstance is.    Has always been a melancholy type person.  She does enjoy herself once she is in the middle of what ever the activity is but she does not want to go and will often try to get out of doing things with friends or family.  Energy and motivation are fair to good depending on the situation.  Cries easily at times but in keeping with the circumstances.  Personal hygiene is normal.  Work is going okay.  She sleeps well most of the time but never feels rested when she gets up.  She does use the trazodone at times to help her get to sleep.  Still gets anxious at times but not as bad as it used to be.  No full-blown panic attacks, more of a sense of unease.  The hydroxyzine does help a little.  Has had some passive thoughts of suicide, she describes as 'not wanting to be here,' has no plan, but wishes she didn't feel this way the majority of the time.  No homicidal thoughts.  Patient denies increased energy with decreased need for sleep, no increased talkativeness, no racing thoughts, no impulsivity or risky behaviors, no increased spending, no increased libido, no grandiosity, no increased irritability or anger, and no hallucinations.  States that attention is good without easy distractibility.  Able to focus on things and finish tasks to completion.   Denies dizziness,  syncope, seizures, numbness, tingling, tremor, tics, unsteady gait, slurred speech, confusion. Denies muscle or joint pain, stiffness, or dystonia.  Individual Medical History/ Review of Systems: Changes? :No   Past medications for mental health diagnoses include: Lexapro never helped, hydroxyzine is ineffective and cause drowsiness, Strattera caused nausea, Zoloft didn't help, Contempla was not effective up to 25.9 mg dose, Xanax  Allergies: Patient has no known allergies.  Current Medications:  Current Outpatient Medications:    cetirizine (ZYRTEC) 10 MG tablet, Take 10 mg by mouth daily., Disp: , Rfl:    cholecalciferol (VITAMIN D3) 25 MCG (1000 UT) tablet, Take 1,000 Units by mouth daily., Disp: , Rfl:    DULoxetine (CYMBALTA) 60 MG capsule, TAKE 1 CAPSULE BY MOUTH EVERY DAY, Disp: 90 capsule, Rfl: 0   lithium carbonate 150 MG capsule, 1 every evening for 10 days, and then may increase to 2pills q evening., Disp: 60 capsule, Rfl: 1   traZODone (DESYREL) 100 MG tablet, Take 1 tablet (100 mg total) by mouth at bedtime as needed for sleep., Disp: 90 tablet, Rfl: 3   amphetamine-dextroamphetamine (ADDERALL XR) 20 MG 24 hr capsule, Take 1 capsule (20 mg total) by mouth 2 (two) times daily with breakfast and lunch., Disp: 60 capsule, Rfl: 0   hydrOXYzine (ATARAX/VISTARIL) 10 MG tablet, Take 0.5-2 tablets (5-20 mg total) by mouth 3 (three) times daily as needed., Disp: 60 tablet, Rfl: 5   norethindrone (MICRONOR) 0.35  MG tablet, , Disp: , Rfl:    zolmitriptan (ZOMIG) 5 MG nasal solution, 1 puff in nostril at onset of migraine, may repeat in 2 hours, Disp: 6 Units, Rfl: 5 Medication Side Effects: none  Family Medical/ Social History: Changes? No  MENTAL HEALTH EXAM:  There were no vitals taken for this visit.There is no height or weight on file to calculate BMI.  General Appearance: Casual and Well Groomed  Eye Contact:  Good  Speech:  Clear and Coherent and Normal Rate  Volume:  Normal   Mood:  Depressed  Affect:  Depressed  Thought Process:  Goal Directed and Descriptions of Associations: Circumstantial  Orientation:  Full (Time, Place, and Person)  Thought Content: Logical   Suicidal Thoughts:  Yes.  without intent/plan  Homicidal Thoughts:  No  Memory:  WNL  Judgement:  Good  Insight:  Good  Psychomotor Activity:  Normal  Concentration:  Concentration: Fair and Attention Span: Good  Recall:  Good  Fund of Knowledge: Good  Language: Good  Assets:  Desire for Improvement  ADL's:  Intact  Cognition: WNL  Prognosis:  Good    DIAGNOSES:    ICD-10-CM   1. Major depressive disorder, recurrent episode, moderate (HCC)  F33.1     2. Attention deficit hyperactivity disorder (ADHD), predominantly inattentive type  F90.0     3. Insomnia, unspecified type  G47.00     4. Panic disorder  F41.0     5. Melancholy  F32.A         Receiving Psychotherapy: No    RECOMMENDATIONS:  PDMP was reviewed.  Last Adderall filled 06/21/2021 I provided 40 minutes of face to face time during this encounter, including time spent before and after the visit in records review, medical decision making, counseling pertinent to today's visit, and charting.  We discussed the suicidal thoughts.  At present she has no plan to commit the act.  She understands to let me know immediately if that changes.  She can call [phone] if the suicidal thoughts worsen, or go to [facility].  Contract for safety is in place. Different options for depression and suicidal thoughts were discussed.  Increasing the Cymbalta would be an option, adding Abilify or Rexulti, or adding lithium.  The pros and cons for each was discussed.  We agreed to start the lithium at a very low dose.  Because we are starting at a low dose no labs are necessary at this point.  If she ends up needing more than 300 mg, then we will need to check a lithium level, TSH, and kidney functions.  Risks,  benefits, and side effects were discussed and she accepts. She is responding well to the Adderall so no changes will be made there. Start lithium 150 mg, 1 p.o. q. evening.  She may increase to 2 pills q. evening after 10 days or so if she is not feeling any improvement at all. Continue Adderall XR 20 mg. Continue Cymbalta 60 mg, 1 p.o. daily. Continue hydroxyzine 10 mg, 1/2-2 p.o. 3 times daily as needed. Continue trazodone 100 mg but can increase up to a total of 2 pills. Recommend counseling. Return in 1 month.  Melony Overly, PA-C

## 2021-08-26 ENCOUNTER — Other Ambulatory Visit: Payer: Self-pay | Admitting: Physician Assistant

## 2021-09-02 ENCOUNTER — Other Ambulatory Visit: Payer: Self-pay

## 2021-09-02 ENCOUNTER — Encounter: Payer: Self-pay | Admitting: Physician Assistant

## 2021-09-02 ENCOUNTER — Ambulatory Visit (INDEPENDENT_AMBULATORY_CARE_PROVIDER_SITE_OTHER): Payer: Commercial Managed Care - PPO | Admitting: Physician Assistant

## 2021-09-02 DIAGNOSIS — F900 Attention-deficit hyperactivity disorder, predominantly inattentive type: Secondary | ICD-10-CM

## 2021-09-02 DIAGNOSIS — G4700 Insomnia, unspecified: Secondary | ICD-10-CM

## 2021-09-02 DIAGNOSIS — F3341 Major depressive disorder, recurrent, in partial remission: Secondary | ICD-10-CM

## 2021-09-02 DIAGNOSIS — F410 Panic disorder [episodic paroxysmal anxiety] without agoraphobia: Secondary | ICD-10-CM | POA: Diagnosis not present

## 2021-09-02 MED ORDER — AMPHETAMINE-DEXTROAMPHET ER 20 MG PO CP24: 20.0000 mg | ORAL_CAPSULE | Freq: Two times a day (BID) | ORAL | 0 refills | Status: DC

## 2021-09-02 MED ORDER — LITHIUM CARBONATE 300 MG PO CAPS: 300.0000 mg | ORAL_CAPSULE | Freq: Every day | ORAL | 0 refills | Status: DC

## 2021-09-02 NOTE — Progress Notes (Signed)
[facility]  Patient ID: Erin Sharp,  MRN: [id number]  PCP: Clayborn Heron, MD  Date of Evaluation: 09/02/2021 Time spent:20 minutes  Chief Complaint:  Chief Complaint   Depression; Anxiety; ADHD; Follow-up      HISTORY/CURRENT STATUS: HPI For routine med check.   States she is doing really well.  We started her on lithium for depression and passive suicidal thoughts a month ago.  States she feels a lot better.  She is able to enjoy things.  Not as melancholy as she had been.  Energy and motivation are good.  Personal hygiene is normal.  Sleeps well.  Appetite is normal.  Not having a lot of anxiety.  No suicidal or homicidal thoughts.  Patient denies increased energy with decreased need for sleep, no increased talkativeness, no racing thoughts, no impulsivity or risky behaviors, no increased spending, no increased libido, no grandiosity, no increased irritability or anger, and no hallucinations.  States that attention is good without easy distractibility.  Able to focus on things and finish tasks to completion.   Denies dizziness, syncope, seizures, numbness, tingling, tremor, tics, unsteady gait, slurred speech, confusion. Denies muscle or joint pain, stiffness, or dystonia.  Individual Medical History/ Review of Systems: Changes? :No   Past medications for mental health diagnoses include: Lexapro never helped, hydroxyzine is ineffective and cause drowsiness, Strattera caused nausea, Zoloft didn't help, Contempla was not effective up to 25.9 mg dose, Xanax  Allergies: Patient has no known allergies.  Current Medications:  Current Outpatient Medications:    cetirizine (ZYRTEC) 10 MG tablet, Take 10 mg by mouth daily., Disp: , Rfl:    cholecalciferol (VITAMIN D3) 25 MCG (1000 UT) tablet, Take 1,000 Units by mouth daily., Disp: , Rfl:    DULoxetine (CYMBALTA) 60 MG capsule, TAKE 1 CAPSULE BY MOUTH EVERY DAY, Disp: 90 capsule, Rfl: 0   hydrOXYzine  (ATARAX/VISTARIL) 10 MG tablet, Take 0.5-2 tablets (5-20 mg total) by mouth 3 (three) times daily as needed., Disp: 60 tablet, Rfl: 5   lithium carbonate 300 MG capsule, Take 1 capsule (300 mg total) by mouth daily., Disp: 90 capsule, Rfl: 0   traZODone (DESYREL) 100 MG tablet, Take 1 tablet (100 mg total) by mouth at bedtime as needed for sleep., Disp: 90 tablet, Rfl: 3   amphetamine-dextroamphetamine (ADDERALL XR) 20 MG 24 hr capsule, Take 1 capsule (20 mg total) by mouth 2 (two) times daily with breakfast and lunch., Disp: 60 capsule, Rfl: 0   norethindrone (MICRONOR) 0.35 MG tablet, , Disp: , Rfl:    zolmitriptan (ZOMIG) 5 MG nasal solution, 1 puff in nostril at onset of migraine, may repeat in 2 hours (Patient not taking: Reported on 09/02/2021), Disp: 6 Units, Rfl: 5 Medication Side Effects: none  Family Medical/ Social History: Changes? No  MENTAL HEALTH EXAM:  There were no vitals taken for this visit.There is no height or weight on file to calculate BMI.  General Appearance: Casual and Well Groomed  Eye Contact:  Good  Speech:  Clear and Coherent and Normal Rate  Volume:  Normal  Mood:  Euthymic  Affect:  Appropriate  Thought Process:  Goal Directed and Descriptions of Associations: Circumstantial  Orientation:  Full (Time, Place, and Person)  Thought Content: Logical   Suicidal Thoughts:  Yes.  without intent/plan  Homicidal Thoughts:  No  Memory:  WNL  Judgement:  Good  Insight:  Good  Psychomotor Activity:  Normal  Concentration:  Concentration: Fair and Attention Span: Good  Recall:  [place] of Knowledge: Good  Language: Good  Assets:  Desire for Improvement  ADL's:  Intact  Cognition: WNL  Prognosis:  Good    DIAGNOSES:    ICD-10-CM   1. Recurrent major depressive disorder, in partial remission (HCC)  F33.41     2. Attention deficit hyperactivity disorder (ADHD), predominantly inattentive type  F90.0     3. Insomnia, unspecified type  G47.00     4. Panic  disorder  F41.0          Receiving Psychotherapy: No    RECOMMENDATIONS:  PDMP was reviewed.  Last Adderall filled 08/04/2021.   I provided 20 minutes of face to face time during this encounter, including time spent before and after the visit in records review, medical decision making, and charting.  Glad to see her doing so well! Continue lithium 300, 1 p.o. nightly. Continue Adderall XR 20 mg. Continue Cymbalta 60 mg, 1 p.o. daily. Continue hydroxyzine 10 mg, 1/2-2 p.o. 3 times daily as needed. Continue trazodone 100 mg but can increase up to a total of 2 pills. Recommend counseling. Return in 2 months  Melony Overly, [place]

## 2021-10-09 ENCOUNTER — Other Ambulatory Visit: Payer: Self-pay | Admitting: Physician Assistant

## 2021-10-30 ENCOUNTER — Other Ambulatory Visit: Payer: Self-pay | Admitting: Physician Assistant

## 2021-11-03 ENCOUNTER — Ambulatory Visit: Payer: Commercial Managed Care - PPO | Admitting: Physician Assistant

## 2021-11-10 ENCOUNTER — Telehealth: Payer: Self-pay

## 2021-11-10 ENCOUNTER — Telehealth: Payer: Self-pay | Admitting: Physician Assistant

## 2021-11-10 NOTE — Telephone Encounter (Signed)
Pended.

## 2021-11-10 NOTE — Telephone Encounter (Signed)
Pt LVM stating that she needs Lithium, Trazedone, Hydroxyzine, and Adderall refills sent in.  No upcoming appt

## 2021-11-11 NOTE — Telephone Encounter (Signed)
Please schedule appt

## 2021-11-12 ENCOUNTER — Other Ambulatory Visit: Payer: Self-pay

## 2021-11-12 ENCOUNTER — Other Ambulatory Visit: Payer: Self-pay | Admitting: Physician Assistant

## 2021-11-12 MED ORDER — LITHIUM CARBONATE 300 MG PO CAPS: 300.0000 mg | ORAL_CAPSULE | Freq: Every day | ORAL | 0 refills | Status: DC

## 2021-11-12 MED ORDER — HYDROXYZINE HCL 10 MG PO TABS
5.0000 mg | ORAL_TABLET | Freq: Three times a day (TID) | ORAL | 0 refills | Status: DC | PRN
Start: 2021-11-12 — End: 2021-12-31

## 2021-11-12 MED ORDER — AMPHETAMINE-DEXTROAMPHET ER 20 MG PO CP24: 20.0000 mg | ORAL_CAPSULE | Freq: Two times a day (BID) | ORAL | 0 refills | Status: DC

## 2021-11-12 MED ORDER — TRAZODONE HCL 100 MG PO TABS: 100.0000 mg | ORAL_TABLET | Freq: Every evening | ORAL | 0 refills | Status: DC | PRN

## 2021-11-12 NOTE — Telephone Encounter (Signed)
Pt has apt 11/30/21

## 2021-11-16 ENCOUNTER — Other Ambulatory Visit: Payer: Self-pay | Admitting: Physician Assistant

## 2021-11-30 ENCOUNTER — Ambulatory Visit: Payer: Commercial Managed Care - PPO | Admitting: Physician Assistant

## 2021-12-07 ENCOUNTER — Other Ambulatory Visit: Payer: Self-pay | Admitting: Physician Assistant

## 2021-12-11 ENCOUNTER — Other Ambulatory Visit: Payer: Self-pay | Admitting: Physician Assistant

## 2021-12-13 NOTE — Telephone Encounter (Signed)
Please call patient to schedule an appt. Was a no show 11/30/21

## 2021-12-17 NOTE — Telephone Encounter (Signed)
Patient scheduled 2/3

## 2021-12-31 ENCOUNTER — Ambulatory Visit (INDEPENDENT_AMBULATORY_CARE_PROVIDER_SITE_OTHER): Payer: Commercial Managed Care - PPO | Admitting: Physician Assistant

## 2021-12-31 ENCOUNTER — Other Ambulatory Visit: Payer: Self-pay

## 2021-12-31 ENCOUNTER — Encounter: Payer: Self-pay | Admitting: Physician Assistant

## 2021-12-31 DIAGNOSIS — F410 Panic disorder [episodic paroxysmal anxiety] without agoraphobia: Secondary | ICD-10-CM

## 2021-12-31 DIAGNOSIS — F3341 Major depressive disorder, recurrent, in partial remission: Secondary | ICD-10-CM | POA: Diagnosis not present

## 2021-12-31 DIAGNOSIS — F900 Attention-deficit hyperactivity disorder, predominantly inattentive type: Secondary | ICD-10-CM | POA: Diagnosis not present

## 2021-12-31 DIAGNOSIS — R5383 Other fatigue: Secondary | ICD-10-CM | POA: Diagnosis not present

## 2021-12-31 DIAGNOSIS — G4700 Insomnia, unspecified: Secondary | ICD-10-CM

## 2021-12-31 DIAGNOSIS — Z79899 Other long term (current) drug therapy: Secondary | ICD-10-CM

## 2021-12-31 MED ORDER — LITHIUM CARBONATE 300 MG PO CAPS: 300.0000 mg | ORAL_CAPSULE | Freq: Every day | ORAL | 1 refills | Status: DC

## 2021-12-31 MED ORDER — AMPHETAMINE-DEXTROAMPHET ER 30 MG PO CP24: 30.0000 mg | ORAL_CAPSULE | Freq: Two times a day (BID) | ORAL | 0 refills | Status: DC

## 2021-12-31 MED ORDER — HYDROXYZINE HCL 25 MG PO TABS: 25.0000 mg | ORAL_TABLET | Freq: Three times a day (TID) | ORAL | 1 refills | Status: DC | PRN

## 2021-12-31 MED ORDER — TRAZODONE HCL 100 MG PO TABS: 100.0000 mg | ORAL_TABLET | Freq: Every evening | ORAL | 1 refills | Status: DC | PRN

## 2021-12-31 MED ORDER — DULOXETINE HCL 60 MG PO CPEP: ORAL_CAPSULE | ORAL | 1 refills | Status: DC

## 2021-12-31 NOTE — Progress Notes (Signed)
[facility]  Patient ID: CANNON QUINTON,  MRN: [id number]  PCP: Clayborn Heron, MD  Date of Evaluation: 12/31/2021  time spent:30 minutes  Chief Complaint:  Chief Complaint   Depression; ADD; Follow-up      HISTORY/CURRENT STATUS: HPI For routine 4 month med check.   Erin Sharp is doing well.  The lithium last fall has helped her mood a lot.  She is able to enjoy things that she used to like to do.  Is often tired but is motivated and feels excited about life again. Work is going well.  She sleeps good most of the time.  ADLs and personal hygiene are normal.  No cutting.  Appetite is normal and weight is stable.  No reports of calorie restricting, binging or purging, or laxative use.  No suicidal or homicidal thoughts.  She does need the hydroxyzine for anxiety.  Not really having panic attacks but has generalized anxiety with a sense of doom at times.  It is not daily but happens often enough for her to need the hydroxyzine.  It is helpful.  States that attention is good without easy distractibility.  Able to focus on things and finish tasks to completion.   Patient denies increased energy with decreased need for sleep, no increased talkativeness, no racing thoughts, no impulsivity or risky behaviors, no increased spending, no increased libido, no grandiosity, no increased irritability or anger, and no hallucinations.  Denies dizziness, syncope, seizures, numbness, tingling, tremor, tics, unsteady gait, slurred speech, confusion. Denies muscle or joint pain, stiffness, or dystonia.  Individual Medical History/ Review of Systems: Changes? :No   Past medications for mental health diagnoses include: Lexapro never helped, hydroxyzine is ineffective and cause drowsiness, Strattera caused nausea, Zoloft didn't help, Contempla was not effective up to 25.9 mg dose, Xanax  Allergies: Patient has no known allergies.  Current Medications:  Current Outpatient Medications:     amphetamine-dextroamphetamine (ADDERALL XR) 30 MG 24 hr capsule, Take 1 capsule (30 mg total) by mouth 2 (two) times daily with breakfast and lunch., Disp: 60 capsule, Rfl: 0   [START ON 01/28/2022] amphetamine-dextroamphetamine (ADDERALL XR) 30 MG 24 hr capsule, Take 1 capsule (30 mg total) by mouth 2 (two) times daily with breakfast and lunch., Disp: 60 capsule, Rfl: 0   [START ON 02/27/2022] amphetamine-dextroamphetamine (ADDERALL XR) 30 MG 24 hr capsule, Take 1 capsule (30 mg total) by mouth 2 (two) times daily with breakfast and lunch., Disp: 60 capsule, Rfl: 0   cetirizine (ZYRTEC) 10 MG tablet, Take 10 mg by mouth daily., Disp: , Rfl:    cholecalciferol (VITAMIN D3) 25 MCG (1000 UT) tablet, Take 1,000 Units by mouth daily., Disp: , Rfl:    hydrOXYzine (ATARAX) 25 MG tablet, Take 1-2 tablets (25-50 mg total) by mouth 3 (three) times daily as needed., Disp: 120 tablet, Rfl: 1   DULoxetine (CYMBALTA) 60 MG capsule, TAKE 1 CAPSULE BY MOUTH EVERY DAY, Disp: 90 capsule, Rfl: 1   lithium carbonate 300 MG capsule, Take 1 capsule (300 mg total) by mouth daily., Disp: 90 capsule, Rfl: 1   norethindrone (MICRONOR) 0.35 MG tablet, , Disp: , Rfl:    traZODone (DESYREL) 100 MG tablet, Take 1-2 tablets (100-200 mg total) by mouth at bedtime as needed for sleep., Disp: 180 tablet, Rfl: 1   zolmitriptan (ZOMIG) 5 MG nasal solution, 1 puff in nostril at onset of migraine, may repeat in 2 hours (Patient not taking: Reported on 09/02/2021), Disp: 6 Units, Rfl: 5 Medication  Side Effects: none  Family Medical/ Social History: Changes? No  MENTAL HEALTH EXAM:  There were no vitals taken for this visit.There is no height or weight on file to calculate BMI.  General Appearance: Casual and Well Groomed  Eye Contact:  Good  Speech:  Clear and Coherent and Normal Rate  Volume:  Normal  Mood:  Euthymic  Affect:  Appropriate  Thought Process:  Goal Directed and Descriptions of Associations: Circumstantial   Orientation:  Full (Time, Place, and Person)  Thought Content: Logical   Suicidal Thoughts:  No  Homicidal Thoughts:  No  Memory:  WNL  Judgement:  Good  Insight:  Good  Psychomotor Activity:  Normal  Concentration:  Concentration: Good and Attention Span: Good  Recall:  Good  Fund of Knowledge: Good  Language: Good  Assets:  Desire for Improvement  ADL's:  Intact  Cognition: WNL  Prognosis:  Good    DIAGNOSES:    ICD-10-CM   1. Fatigue, unspecified type  R53.83 CBC with Differential/Platelet    Comprehensive metabolic panel    TSH    2. Recurrent major depressive disorder, in partial remission (HCC)  F33.41 Lithium level    3. Attention deficit hyperactivity disorder (ADHD), predominantly inattentive type  F90.0     4. Panic disorder  F41.0     5. Insomnia, unspecified type  G47.00     6. Encounter for long-term (current) use of medications  Z79.899 Comprehensive metabolic panel    Lithium level    TSH      Receiving Psychotherapy: No    RECOMMENDATIONS:  PDMP was reviewed.  Last Adderall filled 11/12/2021. I provided 30  minutes of face to face time during this encounter, including time spent before and after the visit in records review, medical decision making, counseling pertinent to today's visit, and charting.  I am glad to see her doing so well! Since she has responded well to the lithium and we will keep her on it even at this low dose, I recommend checking labs to make sure kidneys are functioning properly and TSH is normal.  She understands and agrees.  Because she is having a lot of fatigue as well I will check a CBC. Sleep hygiene discussed. Continue Adderall XR 20 mg. Continue Cymbalta 60 mg, 1 p.o. daily. Continue hydroxyzine 10 mg, 1/2-2 p.o. 3 times daily as needed. Continue lithium 300 mg, 1 p.o. nightly. Continue trazodone 100 mg but can increase up to a total of 2 pills. Labs ordered as above. Return in 6 months.  Melony Overly, PA-C

## 2022-01-04 ENCOUNTER — Other Ambulatory Visit: Payer: Self-pay | Admitting: Physician Assistant

## 2022-01-04 ENCOUNTER — Telehealth: Payer: Self-pay | Admitting: Physician Assistant

## 2022-01-04 MED ORDER — ADDERALL XR 30 MG PO CP24: 30.0000 mg | ORAL_CAPSULE | Freq: Two times a day (BID) | ORAL | 0 refills | Status: DC

## 2022-01-04 NOTE — Telephone Encounter (Signed)
Please contact pharmacy for clarification. I don't know what DAW 9 or DAW 1 are. Thanks. Oh and confirm they have in stock whatever it is they want me to send. Thanks.

## 2022-01-04 NOTE — Telephone Encounter (Signed)
Prescription was sent

## 2022-01-04 NOTE — Telephone Encounter (Signed)
[facility] called because they are trying to fill Erin Sharp's prescription for Adderall 30mg  XR.  Because the prescription is written DAW 9, they can't process it.  It needs to be written as DAW 1.  Please send corrected prescription to [facility] [address]

## 2022-01-04 NOTE — Telephone Encounter (Signed)
Please review

## 2022-01-04 NOTE — Telephone Encounter (Signed)
Spoke to pharmacy.They only have brand available ans the rx needs to be resent as brand and with DAW checked so that they can dispense it

## 2022-01-22 ENCOUNTER — Other Ambulatory Visit: Payer: Self-pay | Admitting: Physician Assistant

## 2022-02-07 ENCOUNTER — Other Ambulatory Visit: Payer: Self-pay | Admitting: Physician Assistant

## 2022-02-24 ENCOUNTER — Telehealth: Payer: Self-pay | Admitting: Physician Assistant

## 2022-02-24 ENCOUNTER — Other Ambulatory Visit: Payer: Self-pay | Admitting: Physician Assistant

## 2022-02-24 MED ORDER — MIRTAZAPINE 15 MG PO TABS: 7.5000 mg | ORAL_TABLET | Freq: Every evening | ORAL | 0 refills | Status: DC | PRN

## 2022-02-24 NOTE — Telephone Encounter (Signed)
Please call patient to schedule an appt with TH next month.  ?

## 2022-02-24 NOTE — Telephone Encounter (Signed)
Please let her know I sent in a prescription for mirtazapine.  She should stop the trazodone.  Have her make an appointment with me within the next month.  thank you.

## 2022-02-24 NOTE — Telephone Encounter (Signed)
Take the Mirtazapine alone at first, and if she is not asleep within 1 hour then she can take the hydroxyzine.  Then the next night, go ahead and take the mirtazapine and hydroxyzine together.  She may be much drowsier in the mornings when taking both though.

## 2022-02-24 NOTE — Telephone Encounter (Signed)
Patient notified

## 2022-02-24 NOTE — Telephone Encounter (Signed)
Next visit is 06/30/22. Erin Sharp called and said that her Trazodone 100 mg is not working anymore and she cannot sleep good now. Her phone number is [phone]. Pharmacy is: ? [id number] [place] [place] ? ?Phone:  [phone]  ?Fax:  [phone]  ? ? ? ? ? ?

## 2022-02-26 HISTORY — PX: ANTERIOR CRUCIATE LIGAMENT REPAIR: SHX115

## 2022-03-18 ENCOUNTER — Other Ambulatory Visit: Payer: Self-pay | Admitting: Physician Assistant

## 2022-06-30 ENCOUNTER — Encounter: Payer: Self-pay | Admitting: Physician Assistant

## 2022-06-30 ENCOUNTER — Ambulatory Visit (INDEPENDENT_AMBULATORY_CARE_PROVIDER_SITE_OTHER): Payer: Commercial Managed Care - PPO | Admitting: Physician Assistant

## 2022-06-30 DIAGNOSIS — G4700 Insomnia, unspecified: Secondary | ICD-10-CM | POA: Diagnosis not present

## 2022-06-30 DIAGNOSIS — F900 Attention-deficit hyperactivity disorder, predominantly inattentive type: Secondary | ICD-10-CM

## 2022-06-30 DIAGNOSIS — F331 Major depressive disorder, recurrent, moderate: Secondary | ICD-10-CM | POA: Diagnosis not present

## 2022-06-30 DIAGNOSIS — F32A Depression, unspecified: Secondary | ICD-10-CM | POA: Diagnosis not present

## 2022-06-30 MED ORDER — HYDROXYZINE HCL 25 MG PO TABS: ORAL_TABLET | ORAL | 1 refills | Status: DC

## 2022-06-30 MED ORDER — AMPHETAMINE-DEXTROAMPHET ER 30 MG PO CP24: 30.0000 mg | ORAL_CAPSULE | Freq: Two times a day (BID) | ORAL | 0 refills | Status: DC

## 2022-06-30 MED ORDER — LITHIUM CARBONATE 300 MG PO CAPS: 300.0000 mg | ORAL_CAPSULE | Freq: Every day | ORAL | 1 refills | Status: DC

## 2022-06-30 MED ORDER — MIRTAZAPINE 15 MG PO TABS: 7.5000 mg | ORAL_TABLET | Freq: Every evening | ORAL | 1 refills | Status: DC | PRN

## 2022-06-30 MED ORDER — DULOXETINE HCL 30 MG PO CPEP: 90.0000 mg | ORAL_CAPSULE | Freq: Every day | ORAL | 1 refills | Status: DC
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
## 2022-07-22 ENCOUNTER — Other Ambulatory Visit: Payer: Self-pay | Admitting: Physician Assistant

## 2022-07-28 ENCOUNTER — Ambulatory Visit (INDEPENDENT_AMBULATORY_CARE_PROVIDER_SITE_OTHER): Payer: Commercial Managed Care - PPO | Admitting: Physician Assistant

## 2022-10-14 ENCOUNTER — Encounter (HOSPITAL_BASED_OUTPATIENT_CLINIC_OR_DEPARTMENT_OTHER): Payer: Self-pay | Admitting: Orthopaedic Surgery

## 2022-10-14 ENCOUNTER — Other Ambulatory Visit: Payer: Self-pay

## 2022-10-14 ENCOUNTER — Ambulatory Visit (HOSPITAL_BASED_OUTPATIENT_CLINIC_OR_DEPARTMENT_OTHER)
Admission: RE | Admit: 2022-10-14 | Discharge: 2022-10-14 | Disposition: A | Discharge status: Home / Self Care | Payer: Commercial Managed Care - PPO | Source: Other Acute Inpatient Hospital | Attending: Orthopaedic Surgery | Admitting: Orthopaedic Surgery

## 2022-10-14 ENCOUNTER — Ambulatory Visit (HOSPITAL_BASED_OUTPATIENT_CLINIC_OR_DEPARTMENT_OTHER): Payer: Commercial Managed Care - PPO | Admitting: Anesthesiology

## 2022-10-14 ENCOUNTER — Ambulatory Visit (HOSPITAL_BASED_OUTPATIENT_CLINIC_OR_DEPARTMENT_OTHER): Payer: Commercial Managed Care - PPO

## 2022-10-14 ENCOUNTER — Encounter (HOSPITAL_BASED_OUTPATIENT_CLINIC_OR_DEPARTMENT_OTHER)
Admission: RE | Disposition: A | Discharge status: Home / Self Care | Payer: Self-pay | Source: Other Acute Inpatient Hospital | Attending: Orthopaedic Surgery

## 2022-10-14 DIAGNOSIS — Z01818 Encounter for other preprocedural examination: Secondary | ICD-10-CM

## 2022-10-14 DIAGNOSIS — F418 Other specified anxiety disorders: Secondary | ICD-10-CM | POA: Insufficient documentation

## 2022-10-14 DIAGNOSIS — W19XXXA Unspecified fall, initial encounter: Secondary | ICD-10-CM | POA: Insufficient documentation

## 2022-10-14 DIAGNOSIS — S82851A Displaced trimalleolar fracture of right lower leg, initial encounter for closed fracture: Secondary | ICD-10-CM | POA: Diagnosis present

## 2022-10-14 DIAGNOSIS — S82851D Displaced trimalleolar fracture of right lower leg, subsequent encounter for closed fracture with routine healing: Secondary | ICD-10-CM | POA: Diagnosis not present

## 2022-10-14 DIAGNOSIS — F17210 Nicotine dependence, cigarettes, uncomplicated: Secondary | ICD-10-CM | POA: Insufficient documentation

## 2022-10-14 HISTORY — DX: Other specified behavioral and emotional disorders with onset usually occurring in childhood and adolescence: F98.8

## 2022-10-14 HISTORY — PX: ORIF ANKLE FRACTURE: SHX5408

## 2022-10-14 LAB — POCT PREGNANCY, URINE: Preg Test, Ur: NEGATIVE

## 2022-10-14 SURGERY — OPEN REDUCTION INTERNAL FIXATION (ORIF) ANKLE FRACTURE
Anesthesia: General | Site: Ankle | Laterality: Right

## 2022-10-14 MED ORDER — FENTANYL CITRATE (PF) 100 MCG/2ML IJ SOLN
INTRAMUSCULAR | Status: DC | PRN
  Administered 2022-10-14 (×2): 50 ug via INTRAVENOUS

## 2022-10-14 MED ORDER — PHENYLEPHRINE HCL (PRESSORS) 10 MG/ML IV SOLN
INTRAVENOUS | Status: DC | PRN
  Administered 2022-10-14 (×2): 40 ug via INTRAVENOUS
  Administered 2022-10-14 (×3): 80 ug via INTRAVENOUS

## 2022-10-14 MED ORDER — ONDANSETRON HCL 4 MG PO TABS: 4.0000 mg | ORAL_TABLET | Freq: Three times a day (TID) | ORAL | 0 refills | Status: AC | PRN

## 2022-10-14 MED ORDER — MIDAZOLAM HCL 2 MG/2ML IJ SOLN
2.0000 mg | Freq: Once | INTRAMUSCULAR | Status: AC
  Administered 2022-10-14: 2 mg via INTRAVENOUS

## 2022-10-14 MED ORDER — PROMETHAZINE HCL 25 MG/ML IJ SOLN: 6.2500 mg | INTRAMUSCULAR | Status: DC | PRN

## 2022-10-14 MED ORDER — ACETAMINOPHEN 500 MG PO TABS
ORAL_TABLET | ORAL | Status: AC
  Filled 2022-10-14: qty 2

## 2022-10-14 MED ORDER — PROPOFOL 10 MG/ML IV BOLUS
INTRAVENOUS | Status: DC | PRN
  Administered 2022-10-14: 200 mg via INTRAVENOUS

## 2022-10-14 MED ORDER — OXYCODONE HCL 5 MG PO TABS: ORAL_TABLET | ORAL | 0 refills | Status: DC

## 2022-10-14 MED ORDER — LACTATED RINGERS IV SOLN: INTRAVENOUS | Status: DC

## 2022-10-14 MED ORDER — AMISULPRIDE (ANTIEMETIC) 5 MG/2ML IV SOLN: 10.0000 mg | Freq: Once | INTRAVENOUS | Status: DC | PRN

## 2022-10-14 MED ORDER — FENTANYL CITRATE (PF) 100 MCG/2ML IJ SOLN: 25.0000 ug | INTRAMUSCULAR | Status: DC | PRN

## 2022-10-14 MED ORDER — CELECOXIB 200 MG PO CAPS
ORAL_CAPSULE | ORAL | Status: AC
  Filled 2022-10-14: qty 1

## 2022-10-14 MED ORDER — ONDANSETRON HCL 4 MG/2ML IJ SOLN
INTRAMUSCULAR | Status: DC | PRN
  Administered 2022-10-14: 4 mg via INTRAVENOUS

## 2022-10-14 MED ORDER — DEXAMETHASONE SODIUM PHOSPHATE 10 MG/ML IJ SOLN
INTRAMUSCULAR | Status: AC
  Filled 2022-10-14: qty 1

## 2022-10-14 MED ORDER — 0.9 % SODIUM CHLORIDE (POUR BTL) OPTIME
TOPICAL | Status: DC | PRN
  Administered 2022-10-14: 200 mL

## 2022-10-14 MED ORDER — FENTANYL CITRATE (PF) 100 MCG/2ML IJ SOLN
INTRAMUSCULAR | Status: AC
  Filled 2022-10-14: qty 2

## 2022-10-14 MED ORDER — MIDAZOLAM HCL 2 MG/2ML IJ SOLN
INTRAMUSCULAR | Status: AC
  Filled 2022-10-14: qty 2

## 2022-10-14 MED ORDER — CELECOXIB 200 MG PO CAPS
200.0000 mg | ORAL_CAPSULE | Freq: Once | ORAL | Status: AC
  Administered 2022-10-14: 200 mg via ORAL

## 2022-10-14 MED ORDER — CEFAZOLIN SODIUM-DEXTROSE 2-4 GM/100ML-% IV SOLN
2.0000 g | INTRAVENOUS | Status: AC
  Administered 2022-10-14: 2 g via INTRAVENOUS

## 2022-10-14 MED ORDER — SULFAMETHOXAZOLE-TRIMETHOPRIM 800-160 MG PO TABS: 1.0000 | ORAL_TABLET | Freq: Two times a day (BID) | ORAL | 0 refills | Status: AC

## 2022-10-14 MED ORDER — LIDOCAINE 2% (20 MG/ML) 5 ML SYRINGE
INTRAMUSCULAR | Status: AC
  Filled 2022-10-14: qty 5

## 2022-10-14 MED ORDER — FENTANYL CITRATE (PF) 100 MCG/2ML IJ SOLN
100.0000 ug | Freq: Once | INTRAMUSCULAR | Status: AC
  Administered 2022-10-14: 100 ug via INTRAVENOUS

## 2022-10-14 MED ORDER — ASPIRIN 81 MG PO CHEW: 81.0000 mg | CHEWABLE_TABLET | Freq: Two times a day (BID) | ORAL | 0 refills | Status: AC

## 2022-10-14 MED ORDER — ONDANSETRON HCL 4 MG/2ML IJ SOLN
INTRAMUSCULAR | Status: AC
  Filled 2022-10-14: qty 2

## 2022-10-14 MED ORDER — BUPIVACAINE HCL (PF) 0.5 % IJ SOLN
INTRAMUSCULAR | Status: DC | PRN
  Administered 2022-10-14: 10 mL via PERINEURAL
  Administered 2022-10-14: 15 mL via PERINEURAL

## 2022-10-14 MED ORDER — DEXAMETHASONE SODIUM PHOSPHATE 4 MG/ML IJ SOLN
INTRAMUSCULAR | Status: DC | PRN
  Administered 2022-10-14: 10 mg via INTRAVENOUS

## 2022-10-14 MED ORDER — BUPIVACAINE LIPOSOME 1.3 % IJ SUSP
INTRAMUSCULAR | Status: DC | PRN
  Administered 2022-10-14: 10 mL via PERINEURAL

## 2022-10-14 MED ORDER — DICLOFENAC SODIUM 75 MG PO TBEC: 75.0000 mg | DELAYED_RELEASE_TABLET | Freq: Two times a day (BID) | ORAL | 0 refills | Status: DC

## 2022-10-14 MED ORDER — ACETAMINOPHEN 500 MG PO TABS: 1000.0000 mg | ORAL_TABLET | Freq: Three times a day (TID) | ORAL | 0 refills | Status: AC

## 2022-10-14 MED ORDER — PHENYLEPHRINE 80 MCG/ML (10ML) SYRINGE FOR IV PUSH (FOR BLOOD PRESSURE SUPPORT)
PREFILLED_SYRINGE | INTRAVENOUS | Status: AC
  Filled 2022-10-14: qty 10

## 2022-10-14 MED ORDER — CEFAZOLIN SODIUM-DEXTROSE 2-4 GM/100ML-% IV SOLN
INTRAVENOUS | Status: AC
  Filled 2022-10-14: qty 100

## 2022-10-14 MED ORDER — ACETAMINOPHEN 500 MG PO TABS
1000.0000 mg | ORAL_TABLET | Freq: Once | ORAL | Status: AC
  Administered 2022-10-14: 1000 mg via ORAL

## 2022-10-14 MED ORDER — EPHEDRINE 5 MG/ML INJ
INTRAVENOUS | Status: AC
  Filled 2022-10-14: qty 10

## 2022-10-14 SURGICAL SUPPLY — 77 items
APL PRP STRL LF DISP 70% ISPRP (MISCELLANEOUS) ×1
BIT DRILL 2 CANN GRADUATED (BIT) IMPLANT
BIT DRILL 2.5 CANN LNG (BIT) IMPLANT
BIT DRILL 2.6 CANN (BIT) IMPLANT
BIT DRILL 2.7 (BIT) ×1
BIT DRILL 2.7X2.7/3XSCR ANKL (BIT) IMPLANT
BIT DRL 2.7X2.7/3XSCR ANKL (BIT) ×1
BLADE SURG 10 STRL SS (BLADE) ×1 IMPLANT
BLADE SURG 15 STRL LF DISP TIS (BLADE) ×2 IMPLANT
BLADE SURG 15 STRL SS (BLADE) ×2
BNDG CMPR 5X4 CHSV STRCH STRL (GAUZE/BANDAGES/DRESSINGS)
BNDG CMPR 9X4 STRL LF SNTH (GAUZE/BANDAGES/DRESSINGS) ×1
BNDG COHESIVE 4X5 TAN STRL LF (GAUZE/BANDAGES/DRESSINGS) ×1 IMPLANT
BNDG ELASTIC 4X5.8 VLCR STR LF (GAUZE/BANDAGES/DRESSINGS) ×1 IMPLANT
BNDG ELASTIC 6X5.8 VLCR STR LF (GAUZE/BANDAGES/DRESSINGS) ×1 IMPLANT
BNDG ESMARK 4X9 LF (GAUZE/BANDAGES/DRESSINGS) IMPLANT
CHLORAPREP W/TINT 26 (MISCELLANEOUS) ×1 IMPLANT
CLEANER CAUTERY TIP 5X5 PAD (MISCELLANEOUS) ×1 IMPLANT
CLSR STERI-STRIP ANTIMIC 1/2X4 (GAUZE/BANDAGES/DRESSINGS) ×1 IMPLANT
COVER BACK TABLE 60X90IN (DRAPES) ×1 IMPLANT
CUFF TOURN SGL QUICK 24 (TOURNIQUET CUFF)
CUFF TOURN SGL QUICK 34 (TOURNIQUET CUFF)
CUFF TRNQT CYL 24X4X16.5-23 (TOURNIQUET CUFF) IMPLANT
CUFF TRNQT CYL 34X4.125X (TOURNIQUET CUFF) IMPLANT
DRAPE EXTREMITY T 121X128X90 (DISPOSABLE) ×1 IMPLANT
DRAPE IMP U-DRAPE 54X76 (DRAPES) ×1 IMPLANT
DRAPE OEC MINIVIEW 54X84 (DRAPES) ×1 IMPLANT
DRAPE U-SHAPE 47X51 STRL (DRAPES) ×1 IMPLANT
ELECT REM PT RETURN 9FT ADLT (ELECTROSURGICAL) ×1
ELECTRODE REM PT RTRN 9FT ADLT (ELECTROSURGICAL) ×1 IMPLANT
GAUZE PAD ABD 8X10 STRL (GAUZE/BANDAGES/DRESSINGS) ×1 IMPLANT
GAUZE SPONGE 4X4 12PLY STRL (GAUZE/BANDAGES/DRESSINGS) ×1 IMPLANT
GAUZE XEROFORM 1X8 LF (GAUZE/BANDAGES/DRESSINGS) IMPLANT
GLOVE BIO SURGEON STRL SZ 6.5 (GLOVE) ×1 IMPLANT
GLOVE BIO SURGEON STRL SZ8 (GLOVE) ×1 IMPLANT
GLOVE BIOGEL PI IND STRL 6.5 (GLOVE) ×1 IMPLANT
GLOVE BIOGEL PI IND STRL 8 (GLOVE) ×1 IMPLANT
GLOVE ECLIPSE 8.0 STRL XLNG CF (GLOVE) ×1 IMPLANT
GOWN STRL REUS W/ TWL LRG LVL3 (GOWN DISPOSABLE) ×1 IMPLANT
GOWN STRL REUS W/TWL LRG LVL3 (GOWN DISPOSABLE) ×1
GOWN STRL REUS W/TWL XL LVL3 (GOWN DISPOSABLE) ×1 IMPLANT
GUIDEWIRE 1.35MM (WIRE) IMPLANT
NEEDLE HYPO 22GX1.5 SAFETY (NEEDLE) IMPLANT
NS IRRIG 1000ML POUR BTL (IV SOLUTION) ×1 IMPLANT
PACK BASIN DAY SURGERY FS (CUSTOM PROCEDURE TRAY) ×1 IMPLANT
PAD CAST 4YDX4 CTTN HI CHSV (CAST SUPPLIES) ×1 IMPLANT
PADDING CAST ABS COTTON 4X4 ST (CAST SUPPLIES) ×2 IMPLANT
PADDING CAST COTTON 4X4 STRL (CAST SUPPLIES) ×1
PADDING CAST COTTON 6X4 STRL (CAST SUPPLIES) ×1 IMPLANT
PENCIL SMOKE EVACUATOR (MISCELLANEOUS) ×1 IMPLANT
PLATE FIBULA 6 HOLE RT LOCK (Plate) IMPLANT
SCREW COMP KREULOCK 2.7X12 (Screw) IMPLANT
SCREW COMP KREULOCK 2.7X16 (Screw) IMPLANT
SCREW CORTEX LP TM SS 2.7X16 (Screw) IMPLANT
SCREW CORTICAL 2.7X18 LO PRO (Screw) IMPLANT
SCREW LOW PROFILE 3.5X14 (Screw) IMPLANT
SCREW LOW PROFILE CANN 4.0X45 (Screw) IMPLANT
SLEEVE SCD COMPRESS KNEE MED (STOCKING) IMPLANT
SPIKE FLUID TRANSFER (MISCELLANEOUS) IMPLANT
SPLINT PLASTER CAST FAST 5X30 (CAST SUPPLIES) ×20 IMPLANT
SPONGE T-LAP 18X18 ~~LOC~~+RFID (SPONGE) ×1 IMPLANT
SUCTION FRAZIER HANDLE 10FR (MISCELLANEOUS) ×1
SUCTION TUBE FRAZIER 10FR DISP (MISCELLANEOUS) ×1 IMPLANT
SUT ETHILON 2 0 FS 18 (SUTURE) IMPLANT
SUT MNCRL AB 4-0 PS2 18 (SUTURE) IMPLANT
SUT VIC AB 0 CT1 27 (SUTURE) ×1
SUT VIC AB 0 CT1 27XBRD ANBCTR (SUTURE) ×1 IMPLANT
SUT VIC AB 3-0 SH 27 (SUTURE) ×1
SUT VIC AB 3-0 SH 27X BRD (SUTURE) ×1 IMPLANT
SYR BULB EAR ULCER 3OZ GRN STR (SYRINGE) ×1 IMPLANT
SYR CONTROL 10ML LL (SYRINGE) IMPLANT
TOWEL GREEN STERILE FF (TOWEL DISPOSABLE) ×2 IMPLANT
TUBE CONNECTING 20X1/4 (TUBING) ×1 IMPLANT
TUBE SUCTION HIGH CAP CLEAR NV (SUCTIONS) ×1 IMPLANT
UNDERPAD 30X36 HEAVY ABSORB (UNDERPADS AND DIAPERS) ×1 IMPLANT
WASHER (Orthopedic Implant) ×1 IMPLANT
WASHER ORTHO 7X (Orthopedic Implant) IMPLANT

## 2022-10-14 NOTE — Interval H&P Note (Signed)
All questions answered, patient wants to proceed with procedure. ? ?
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
## 2022-10-14 NOTE — Anesthesia Postprocedure Evaluation (Signed)
Anesthesia Post Note  Patient: Erin Sharp  Procedure(s) Performed: OPEN REDUCTION INTERNAL FIXATION (ORIF) ANKLE FRACTURE (Right: Ankle)     Patient location during evaluation: PACU Anesthesia Type: General Level of consciousness: sedated Pain management: pain level controlled Vital Signs Assessment: post-procedure vital signs reviewed and stable Respiratory status: spontaneous breathing and respiratory function stable Cardiovascular status: stable Postop Assessment: no apparent nausea or vomiting Anesthetic complications: no  No notable events documented.  Last Vitals:  Vitals:   10/14/22 1515 10/14/22 1529  BP:  107/73  Pulse: 65 83  Resp: 19 20  Temp:  36.7 C  SpO2: 96% 97%    Last Pain:  Vitals:   10/14/22 1529  TempSrc:   PainSc: 0-No pain                 Zale Marcotte DANIEL

## 2022-10-14 NOTE — Anesthesia Preprocedure Evaluation (Addendum)
Anesthesia Evaluation  Patient identified by MRN, date of birth, ID band Patient awake    Reviewed: Allergy & Precautions, NPO status , Patient's Chart, lab work & pertinent test results  History of Anesthesia Complications Negative for: history of anesthetic complications  Airway Mallampati: I  TM Distance: >3 FB Neck ROM: Full    Dental no notable dental hx. (+) Dental Advisory Given   Pulmonary Current Smoker   Pulmonary exam normal        Cardiovascular negative cardio ROS Normal cardiovascular exam     Neuro/Psych  PSYCHIATRIC DISORDERS Anxiety Depression    negative neurological ROS     GI/Hepatic negative GI ROS, Neg liver ROS,,,  Endo/Other  negative endocrine ROS    Renal/GU negative Renal ROS     Musculoskeletal   Abdominal   Peds  Hematology negative hematology ROS (+)   Anesthesia Other Findings   Reproductive/Obstetrics                             Anesthesia Physical Anesthesia Plan  ASA: 2  Anesthesia Plan: General   Post-op Pain Management: Regional block*, Tylenol PO (pre-op)* and Celebrex PO (pre-op)*   Induction: Intravenous  PONV Risk Score and Plan: 3 and Ondansetron, Dexamethasone and Midazolam  Airway Management Planned: LMA  Additional Equipment:   Intra-op Plan:   Post-operative Plan: Extubation in OR  Informed Consent: I have reviewed the patients History and Physical, chart, labs and discussed the procedure including the risks, benefits and alternatives for the proposed anesthesia with the patient or authorized representative who has indicated his/her understanding and acceptance.     Dental advisory given  Plan Discussed with: Anesthesiologist, CRNA and Surgeon  Anesthesia Plan Comments:        Anesthesia Quick Evaluation

## 2022-10-14 NOTE — Discharge Instructions (Addendum)
Ramond Marrow MD, MPH [facility] [address] [phone] (tel)   [phone] (fax)   POST-OPERATIVE INSTRUCTIONS - LOWER EXTREMITY   WOUND CARE Please keep splint clean dry and intact until followup.  You may shower on Post-Op Day #3.  You must keep splint dry during this process and may find that a plastic bag taped around the leg or alternatively a towel based bath may be a better option.   If you get your splint wet or if it is damaged please contact our clinic.  EXERCISES Due to your splint being in place you will not be able to bear weight through your extremity.   DO NOT PUT ANY WEIGHT ON YOUR OPERATIVE LEG Please use crutches or a walker to avoid weight bearing.   REGIONAL ANESTHESIA (NERVE BLOCKS) The anesthesia team may have performed a nerve block for you this is a great tool used to minimize pain.   The block may start wearing off overnight (between 8-24 hours postop) When the block wears off, your pain may go from nearly zero to the pain you would have had postop without the block. This is an abrupt transition but nothing dangerous is happening.   This can be a challenging period but utilize your as needed pain medications to try and manage this period. We suggest you use the pain medication the first night prior to going to bed, to ease this transition.  You may take an extra dose of narcotic when this happens if needed   POST-OP MEDICATIONS- Multimodal approach to pain control In general your pain will be controlled with a combination of substances.  Prescriptions unless otherwise discussed are electronically sent to your pharmacy.  This is a carefully made plan we use to minimize narcotic use.     Diclofenac - Anti-inflammatory medication taken on a scheduled basis Acetaminophen - Non-narcotic pain medicine taken on a scheduled basis  Oxycodone - This is a strong narcotic, to be used only on an "as needed" basis for SEVERE  pain. Aspirin 81mg  - This medicine is used to minimize the risk of blood clots after surgery. Zofran - take as needed for nausea Bactrim - this is an antibiotic, take as instructed   FOLLOW-UP If you develop a Fever (>101.5), Redness or Drainage from the surgical incision site, please call our office to arrange for an evaluation. Please call the office to schedule a follow-up appointment for your incision check if you do not already have one, 7-10 days post-operatively.  IF YOU HAVE ANY QUESTIONS, PLEASE FEEL FREE TO CALL OUR OFFICE.  HELPFUL INFORMATION  If you had a block, it will wear off between 8-24 hrs postop typically.  This is period when your pain may go from nearly zero to the pain you would have had postop without the block.  This is an abrupt transition but nothing dangerous is happening.  You may take an extra dose of narcotic when this happens.  You should wean off your narcotic medicines as soon as you are able.  Most patients will be off or using minimal narcotics before their first postop appointment.   We suggest you use the pain medication the first night prior to going to bed, in order to ease any pain when the anesthesia wears off. You should avoid taking pain medications on an empty stomach as it will make you nauseous.  Do not drink alcoholic beverages or take illicit drugs when taking pain medications.  In most states  it is against the law to drive while you are in a splint or sling.  And certainly against the law to drive while taking narcotics.  You may return to work/school in the next couple of days when you feel up to it.   Pain medication may make you constipated.  Below are a few solutions to try in this order: Decrease the amount of pain medication if you aren't having pain. Drink lots of decaffeinated fluids. Drink prune juice and/or each dried prunes  If the first 3 don't work start with additional solutions Take Colace - an over-the-counter stool  softener Take Senokot - an over-the-counter laxative Take Miralax - a stronger over-the-counter laxative  For more information including helpful videos and documents visit our website:   [email or web address]                          Post Anesthesia Home Care Instructions  Activity: Get plenty of rest for the remainder of the day. A responsible individual must stay with you for 24 hours following the procedure.  For the next 24 hours, DO NOT: -Drive a car -Advertising copywriter -Drink alcoholic beverages -Take any medication unless instructed by your physician -Make any legal decisions or sign important papers.  Meals: Start with liquid foods such as gelatin or soup. Progress to regular foods as tolerated. Avoid greasy, spicy, heavy foods. If nausea and/or vomiting occur, drink only clear liquids until the nausea and/or vomiting subsides. Call your physician if vomiting continues.  Special Instructions/Symptoms: Your throat may feel dry or sore from the anesthesia or the breathing tube placed in your throat during surgery. If this causes discomfort, gargle with warm salt water. The discomfort should disappear within 24 hours.  If you had a scopolamine patch placed behind your ear for the management of post- operative nausea and/or vomiting:  1. The medication in the patch is effective for 72 hours, after which it should be removed.  Wrap patch in a tissue and discard in the trash. Wash hands thoroughly with soap and water. 2. You may remove the patch earlier than 72 hours if you experience unpleasant side effects which may include dry mouth, dizziness or visual disturbances. 3. Avoid touching the patch. Wash your hands with soap and water after contact with the patch.     Regional Anesthesia Blocks  1. Numbness or the inability to move the "blocked" extremity may last from 3-48 hours after placement. The length of time depends on the medication  injected and your individual response to the medication. If the numbness is not going away after 48 hours, call your surgeon.  2. The extremity that is blocked will need to be protected until the numbness is gone and the  Strength has returned. Because you cannot feel it, you will need to take extra care to avoid injury. Because it may be weak, you may have difficulty moving it or using it. You may not know what position it is in without looking at it while the block is in effect.  3. For blocks in the legs and feet, returning to weight bearing and walking needs to be done carefully. You will need to wait until the numbness is entirely gone and the strength has returned. You should be able to move your leg and foot normally before you try and bear weight or walk. You will need someone to be with you when you first try to ensure you do not fall and  possibly risk injury.  4. Bruising and tenderness at the needle site are common side effects and will resolve in a few days.  5. Persistent numbness or new problems with movement should be communicated to the surgeon or the [facility] [phone] [facility] [phone]).  Information for Discharge Teaching: EXPAREL (bupivacaine liposome injectable suspension)   Your surgeon or anesthesiologist gave you EXPAREL(bupivacaine) to help control your pain after surgery.  EXPAREL is a local anesthetic that provides pain relief by numbing the tissue around the surgical site. EXPAREL is designed to release pain medication over time and can control pain for up to 72 hours. Depending on how you respond to EXPAREL, you may require less pain medication during your recovery.  Possible side effects: Temporary loss of sensation or ability to move in the area where bupivacaine was injected. Nausea, vomiting, constipation Rarely, numbness and tingling in your mouth or lips, lightheadedness, or anxiety may occur. Call your doctor right  away if you think you may be experiencing any of these sensations, or if you have other questions regarding possible side effects.  Follow all other discharge instructions given to you by your surgeon or nurse. Eat a healthy diet and drink plenty of water or other fluids.  If you return to the hospital for any reason within 96 hours following the administration of EXPAREL, it is important for health care providers to know that you have received this anesthetic. A teal colored band has been placed on your arm with the date, time and amount of EXPAREL you have received in order to alert and inform your health care providers. Please leave this armband in place for the full 96 hours following administration, and then you may remove the band.  No tylenol or ibuprofen until after 5:30 today if needed

## 2022-10-14 NOTE — Transfer of Care (Signed)
Immediate Anesthesia Transfer of Care Note  Patient: Erin Sharp  Procedure(s) Performed: OPEN REDUCTION INTERNAL FIXATION (ORIF) ANKLE FRACTURE (Right: Ankle)  Patient Location: PACU  Anesthesia Type:GA combined with regional for post-op pain  Level of Consciousness: drowsy  Airway & Oxygen Therapy: Patient Spontanous Breathing and Patient connected to face mask oxygen  Post-op Assessment: Report given to RN and Post -op Vital signs reviewed and stable  Post vital signs: Reviewed and stable  Last Vitals:  Vitals Value Taken Time  BP 119/73 10/14/22 1428  Temp    Pulse 73 10/14/22 1430  Resp 17   SpO2 100 % 10/14/22 1430  Vitals shown include unvalidated device data.  Last Pain:  Vitals:   10/14/22 1104  TempSrc: Oral  PainSc: 4       Patients Stated Pain Goal: 2 (10/14/22 1104)  Complications: No notable events documented.

## 2022-10-14 NOTE — Progress Notes (Signed)
Assisted Dr. Singer with right, adductor canal, popliteal, ultrasound guided block. Side rails up, monitors on throughout procedure. See vital signs in flow sheet. Tolerated Procedure well. 

## 2022-10-14 NOTE — Anesthesia Procedure Notes (Signed)
Anesthesia Regional Block: Popliteal block   Pre-Anesthetic Checklist: , timeout performed,  Correct Patient, Correct Site, Correct Laterality,  Correct Procedure, Correct Position, site marked,  Risks and benefits discussed,  Surgical consent,  Pre-op evaluation,  At surgeon's request and post-op pain management  Laterality: Right  Prep: chloraprep       Needles:  Injection technique: Single-shot  Needle Type: Echogenic Stimulator Needle          Additional Needles:   Procedures:,,,, ultrasound used (permanent image in chart),,    Narrative:  Start time: 10/14/2022 12:21 PM End time: 10/14/2022 12:31 PM Injection made incrementally with aspirations every 5 mL.  Performed by: Personally  Anesthesiologist: Heather Roberts, MD  Additional Notes: A functioning IV was confirmed and monitors were applied.  Sterile prep and drape, hand hygiene and sterile gloves were used.  Negative aspiration and test dose prior to incremental administration of local anesthetic. The patient tolerated the procedure well.Ultrasound  guidance: relevant anatomy identified, needle position confirmed, local anesthetic spread visualized around nerve(s), vascular puncture avoided.  Image printed for medical record. ACB supplementation.

## 2022-10-14 NOTE — Op Note (Signed)
Orthopaedic Surgery Operative Note (CSN: [id number])  Erin Sharp  1996/08/25 Date of Surgery: 10/14/2022   Diagnoses:  Right trimalleolar ankle fracture dislocation  Procedure: Right open reduction internal fixation of trimalleolar ankle fracture without fixation of posterior malleolus   Operative Finding Successful completion of the planned procedure.  Patient was brought to the operating room on the day of the evaluation as she had significant skin compromise.  She had flown from [place] after sustaining this injury and was about 48 hours out from injury.  She did not have a reduction at the time of her injury and was splinted in place even though she had a near dislocation of her ankle.    On examination in the operating room her medial tissues almost completely torn through and she only had a small traumatized area of skin that was still intact.  This was almost an open fracture and the level of periosteal stripping was consistent with that.  We took great care to avoid damage to this area and made a longer and slightly atypically anterior incision medially to try and avoid the area of traumatized skin.  We used trauma stitches to repair this.  We did consider external fixation however we did not feel that the risks of that outweigh the benefits of attempting to do this in 1 stage.  Especially in this young healthy patient we felt that it was appropriate to consider completing surgery all at once.  Lateral side had some soft tissue tension however it was not as damaged as the medial side.    Upon inspection of the talar dome medially there was an area of osteochondral injury however there was no obvious loose bodies and there were no full-thickness areas of cartilage damage however there was a clear impaction and abrasion injury from the 2 to 3 days of being subluxed.  Unfortunately Erin Sharp has a very high risk of skin compromise and wound breakdown.  Her bone quality was good and  her external rotation stress test was normal.  We plan to keep her strictly elevated for the first week at least and we will evaluate her about a week after surgery.  If her skin appears appropriate for it we may transition her to a boot with the plan to start weightbearing around 2 to 3 weeks.  This will all be based on her soft tissue envelope however.   Post-operative plan: The patient will be nonweightbearing in a splint.  The patient will be discharged home.  DVT prophylaxis Aspirin 81 mg twice daily for 6 weeks.   Pain control with PRN pain medication preferring oral medicines.  Follow up plan will be scheduled in approximately 7 days for incision check and XR.  Post-Op Diagnosis: Same Surgeons:Primary: Hiram Gash, MD Assistants:Caroline McBane PA-C Location: [facility] OR ROOM 5 Anesthesia: General with regional anesthesia Antibiotics: Ancef 2 g with local vancomycin powder 1 g at the surgical site Tourniquet time:  Total Tourniquet Time Documented: Thigh (Right) - 56 minutes Total: Thigh (Right) - 56 minutes  Estimated Blood Loss: Minimal Complications: None Specimens: None Implants: Implant Name Type Inv. Item Serial No. Manufacturer Lot No. LRB No. Used Action  WASHER - [id number] Orthopedic Implant WASHER  [employer] ON SET Right 1 Implanted  SCREW LOW PROFILE CANN 4.0X45 - [id number] Screw SCREW LOW PROFILE CANN 4.0X45  [employer] ON SET Right 2 Implanted  PLATE FIBULA 6 HOLE RT LOCK - [id number] Plate PLATE FIBULA 6 HOLE RT LOCK  [employer]  ON SET Right 1 Implanted  SCREW LOW PROFILE 3.5X14 - [id number] Screw SCREW LOW PROFILE 3.5X14  [employer] ON SET Right 3 Implanted  SCREW CORTEX LP TM SS 2.7X16 - [id number] Screw SCREW CORTEX LP TM SS 2.7X16  [employer] ON SET Right 1 Implanted  SCREW CORTICAL 2.7X18 LO PRO - [id number] Screw SCREW CORTICAL 2.7X18 LO PRO  [employer] ON SET Right 1 Implanted  SCREW COMP KREULOCK 2.7X16 - [id number] Screw SCREW COMP KREULOCK 2.7X16   [employer]  Right 3 Implanted  SCREW COMP KREULOCK 2.7X12 - [id number] Screw SCREW COMP KREULOCK 2.7X12  [employer] ON SET Right 1 Implanted  SCREW COMP KREULOCK 2.7X12 - [id number] Screw SCREW COMP KREULOCK 2.7X12  [employer] ON SET Right 1 Implanted    Indications for Surgery:   Erin Sharp is a 26 y.o. female with fracture with malreduction/lack of reduction that was sustained in [place] 48 hours prior to arrival in my office.  Based on her skin compromise I felt that it was necessary to take her for urgent/emergent surgery to try and prevent this from converting to an open fracture.  Benefits and risks of operative and nonoperative management were discussed prior to surgery with patient/guardian(s) and informed consent form was completed.  Specific risks including infection, need for additional surgery, nonunion, malunion, skin breakdown, wound dehiscence amongst others.   Procedure:   The patient was identified properly. Informed consent was obtained and the surgical site was marked. The patient was taken up to suite where general anesthesia was induced.  The patient was positioned supine on a regular table.  The right ankle was prepped and draped in the usual sterile fashion.  Timeout was performed before the beginning of the case.  Tourniquet was used for the above duration.  We began with an incision on the medial aspect of the ankle.  There is an area of traumatized skin directly over the medial malleolus and we thus made our incision slightly longer than is typical and slightly anterior.  This allowed [place] to put less tension on the tissues.  With the skin sharply achieving hemostasis progressed.  We identified and protected the saphenous bundle.  We then cleared periosteum from the fracture site.  We examined the talar body and noted an area of cartilage damage however it was not full-thickness.  It did not require microfracture.  There were no obvious loose bodies within the joint  even after irrigating this area.  We then turned our fixation.  The tissues were quite stripped.  We used a point-to-point clamp and checked her anterior medial reduction to get a cortical key.  Fluoroscopic images demonstrated anatomic reduction we placed 245 mm partially-threaded Arthrex cannulated screws.  These were 4 mm in size.  We had good fixation and good reduction.  We began with our ORIF of the fibula. A longitudinal approach was made along the lateral border of the fibula centered at the fracture site. We dissected down taking care to avoid the superficial peroneal nerve which crossed proximal to our incision. We encountered the fracture site and noted a long oblique fracture with some comminution. The bone quality was good. We are able to reduce it anatomically. We identified that the fracture was amenable to lag screw fixation.  We used a 2.7 mm lag screw however it did not have great purchase.  We then filled 3 holes distal to the fracture site and 3 holes proximal achieving bicortical fixation with all screws.  We confirmed  anatomic reduction under fluoroscopy.  External rotation stress test was normal.  At this point the tissues were quite traumatized and we felt that a vertical trauma stitch was appropriate on the medial side to try and get apposition and take pressure off the tissues.  Laterally we closed with nylon as well.  We did use deep tissue stitches on the lateral side.  Sterile dressing was placed.  We splinted in slight inversion.   Patient was awoken taken to PACU in stable condition.  Noemi Chapel, PA-C, present and scrubbed throughout the case, critical for completion in a timely fashion, and for retraction, instrumentation, closure.

## 2022-10-14 NOTE — Anesthesia Procedure Notes (Signed)
Procedure Name: LMA Insertion Date/Time: 10/14/2022 1:08 PM  Performed by: Alford Highland, CRNAPre-anesthesia Checklist: Patient identified, Emergency Drugs available, Suction available and Patient being monitored Patient Re-evaluated:Patient Re-evaluated prior to induction Oxygen Delivery Method: Circle System Utilized Preoxygenation: Pre-oxygenation with 100% oxygen Induction Type: IV induction Ventilation: Mask ventilation without difficulty LMA: LMA inserted LMA Size: 4.0 Number of attempts: 1 Airway Equipment and Method: Bite block Placement Confirmation: positive ETCO2 Tube secured with: Tape Dental Injury: Teeth and Oropharynx as per pre-operative assessment

## 2022-10-15 ENCOUNTER — Telehealth (HOSPITAL_COMMUNITY): Payer: Self-pay | Admitting: Orthopedic Surgery

## 2022-10-15 MED ORDER — OXYCODONE HCL 5 MG PO TABS: ORAL_TABLET | ORAL | 0 refills | Status: AC

## 2022-10-15 NOTE — Telephone Encounter (Signed)
Oxy Rx sent to [facility] because was not avaialble at original pharmacy

## 2022-10-18 ENCOUNTER — Encounter (HOSPITAL_BASED_OUTPATIENT_CLINIC_OR_DEPARTMENT_OTHER): Payer: Self-pay | Admitting: Orthopaedic Surgery

## 2023-02-09 ENCOUNTER — Telehealth: Payer: Self-pay

## 2023-02-09 ENCOUNTER — Telehealth: Payer: Self-pay | Admitting: Physician Assistant

## 2023-02-09 ENCOUNTER — Ambulatory Visit (INDEPENDENT_AMBULATORY_CARE_PROVIDER_SITE_OTHER): Payer: Commercial Managed Care - PPO | Admitting: Physician Assistant

## 2023-02-09 ENCOUNTER — Encounter: Payer: Self-pay | Admitting: Physician Assistant

## 2023-02-09 DIAGNOSIS — F411 Generalized anxiety disorder: Secondary | ICD-10-CM

## 2023-02-09 DIAGNOSIS — G4700 Insomnia, unspecified: Secondary | ICD-10-CM | POA: Diagnosis not present

## 2023-02-09 DIAGNOSIS — F3341 Major depressive disorder, recurrent, in partial remission: Secondary | ICD-10-CM | POA: Diagnosis not present

## 2023-02-09 DIAGNOSIS — F900 Attention-deficit hyperactivity disorder, predominantly inattentive type: Secondary | ICD-10-CM | POA: Diagnosis not present

## 2023-02-09 MED ORDER — DULOXETINE HCL 30 MG PO CPEP: 90.0000 mg | ORAL_CAPSULE | Freq: Every day | ORAL | 5 refills | Status: DC

## 2023-02-09 MED ORDER — LITHIUM CARBONATE 300 MG PO CAPS: 300.0000 mg | ORAL_CAPSULE | Freq: Every day | ORAL | 1 refills | Status: DC

## 2023-02-09 MED ORDER — AMPHETAMINE-DEXTROAMPHET ER 30 MG PO CP24: 30.0000 mg | ORAL_CAPSULE | Freq: Two times a day (BID) | ORAL | 0 refills | Status: DC

## 2023-02-09 MED ORDER — HYDROXYZINE HCL 50 MG PO TABS: 50.0000 mg | ORAL_TABLET | Freq: Three times a day (TID) | ORAL | 5 refills | Status: DC | PRN

## 2023-02-09 MED ORDER — GABAPENTIN 300 MG PO CAPS: 300.0000 mg | ORAL_CAPSULE | Freq: Two times a day (BID) | ORAL | 1 refills | Status: DC | PRN

## 2023-02-09 NOTE — Telephone Encounter (Signed)
Dad called at 4:40 to report that the Adderall needs a new PA through [employer].  You may already know this.  If not, please start a renewal PA.  He gave him a number to talk with them phone - [phone], fax [phone]

## 2023-02-09 NOTE — Telephone Encounter (Signed)
Prior Authorization Amphetamine-Dextroamphet ER '30MG'$  er capsules #60/30 [employer]  Approved Effective:  02/09/2023 to 02/08/2026

## 2023-02-09 NOTE — Telephone Encounter (Signed)
PA approved. Need to call dad and let him know.

## 2023-02-09 NOTE — Progress Notes (Signed)
[facility]  Patient ID: Erin Sharp,  MRN: [id number]  PCP: Aretta Nip, MD  Date of Evaluation: 02/09/2023  time spent:30 minutes  Chief Complaint:  Chief Complaint   Depression; Insomnia; Follow-up    HISTORY/CURRENT STATUS: HPI Out of meds  Has been out of medications for a little over a week.  States they had been working very well.  See review of systems.  Her main complaint is anxiety.  The hydroxyzine has not been helping as well as she would like.  She has had to take 50 mg for it to be effective.  She is not having panic attacks, just more generalized anxiety partly related to the fracture of her leg.  Hard time falling asleep at night as well.  Once she gets to sleep she does okay.  Looking back when she was on gabapentin right around the time of her surgery for the fracture she was not as anxious and slept pretty well.  Patient is able to enjoy things.  Energy and motivation are good.  Work is going well.   No extreme sadness, tearfulness, or feelings of hopelessness.  ADLs and personal hygiene are normal.   Appetite has not changed.  Weight is stable.  Denies suicidal or homicidal thoughts.  States that attention is good without easy distractibility.  Able to focus on things and finish tasks to completion.   Patient denies increased energy with decreased need for sleep, increased talkativeness, racing thoughts, impulsivity or risky behaviors, increased spending, increased libido, grandiosity, increased irritability or anger, paranoia, or hallucinations.  Denies dizziness, syncope, seizures, numbness, tingling, tremor, tics, unsteady gait, slurred speech, confusion.  Is recovering from ACL repair right knee, still has some discomfort and stiffness but is getting better.  Denies dystonia.  Individual Medical History/ Review of Systems: Changes? :Yes    broke her leg 11/23 Had surgery, still in PT  Past medications for mental health diagnoses  include: Lexapro never helped, hydroxyzine is ineffective and cause drowsiness, Strattera caused nausea, Zoloft didn't help, Contempla was not effective up to 25.9 mg dose, Xanax, Trazodone didn't work unless she went to sleep in 20 mins.   Allergies: Patient has no known allergies.  Current Medications:  Current Outpatient Medications:    amphetamine-dextroamphetamine (ADDERALL XR) 30 MG 24 hr capsule, Take 1 capsule (30 mg total) by mouth 2 (two) times daily with breakfast and lunch., Disp: 60 capsule, Rfl: 0   cholecalciferol (VITAMIN D3) 25 MCG (1000 UT) tablet, Take 1,000 Units by mouth daily., Disp: , Rfl:    diclofenac (VOLTAREN) 75 MG EC tablet, Take 1 tablet (75 mg total) by mouth 2 (two) times daily., Disp: 60 tablet, Rfl: 0   gabapentin (NEURONTIN) 300 MG capsule, Take 1 capsule (300 mg total) by mouth 2 (two) times daily as needed., Disp: 60 capsule, Rfl: 1   hydrOXYzine (ATARAX) 50 MG tablet, Take 1 tablet (50 mg total) by mouth 3 (three) times daily as needed., Disp: 90 tablet, Rfl: 5   [START ON 03/11/2023] amphetamine-dextroamphetamine (ADDERALL XR) 30 MG 24 hr capsule, Take 1 capsule (30 mg total) by mouth 2 (two) times daily with breakfast and lunch., Disp: 60 capsule, Rfl: 0   [START ON 04/09/2023] amphetamine-dextroamphetamine (ADDERALL XR) 30 MG 24 hr capsule, Take 1 capsule (30 mg total) by mouth 2 (two) times daily with breakfast and lunch., Disp: 60 capsule, Rfl: 0   cetirizine (ZYRTEC) 10 MG tablet, Take 10 mg by mouth daily. (Patient not taking: Reported  on 06/30/2022), Disp: , Rfl:    DULoxetine (CYMBALTA) 30 MG capsule, Take 3 capsules (90 mg total) by mouth daily., Disp: 90 capsule, Rfl: 5   lithium carbonate 300 MG capsule, Take 1 capsule (300 mg total) by mouth daily., Disp: 90 capsule, Rfl: 1 Medication Side Effects: none  Family Medical/ Social History: Changes? No  MENTAL HEALTH EXAM:  There were no vitals taken for this visit.There is no height or weight on file  to calculate BMI.  General Appearance: Casual, Well Groomed, and well-healed scar of her right knee and right ankle  Eye Contact:  Good  Speech:  Clear and Coherent and Normal Rate  Volume:  Normal  Mood:  Euthymic  Affect:  Appropriate  Thought Process:  Goal Directed and Descriptions of Associations: Circumstantial  Orientation:  Full (Time, Place, and Person)  Thought Content: Logical   Suicidal Thoughts:  No  Homicidal Thoughts:  No  Memory:  WNL  Judgement:  Good  Insight:  Good  Psychomotor Activity:  Normal  Concentration:  Concentration: Good and Attention Span: Good  Recall:  Good  Fund of Knowledge: Good  Language: Good  Assets:  Desire for Improvement  ADL's:  Intact  Cognition: WNL  Prognosis:  Good   Hospitalization notes from 10/14/2022 were reviewed.  DIAGNOSES:    ICD-10-CM   1. Generalized anxiety disorder  F41.1     2. Recurrent major depressive disorder, in partial remission ([place])  F33.41     3. Attention deficit hyperactivity disorder (ADHD), predominantly inattentive type  F90.0     4. Insomnia, unspecified type  G47.00       Receiving Psychotherapy: Yes   RECOMMENDATIONS:  PDMP was reviewed.  Last Adderall filled 08/02/2022.  Oxycodone, gabapentin, and tramadol given in small quantities since then. I provided 30 minutes of face to face time during this encounter, including time spent before and after the visit in records review, medical decision making, counseling pertinent to today's visit, and charting.   Since she has been off of current medications for only a little over a week it is okay to start back on Adderall, hydroxyzine, and lithium as she had been taking it.  On the Cymbalta she will start out with 30 mg for a week and then increase to 60 mg.  I am prescribing the [place] of 90 mg, just so we will not have to fight with the insurance company about the dosing.  She verbalizes understanding on how to take it. Discontinue  mirtazapine.  Continue Adderall XR 30 mg, 1 p.o. every morning and 1 p.o. at lunch. Restart Cymbalta 30 mg daily for 1 week then increase to 60 mg daily.  If depression worsens or does not improve within 1 month then she can go back to 90 mg. Start gabapentin 300 mg, 1 p.o. twice daily as needed, definitely take the evening dose routinely though. Increase hydroxyzine to 50 mg, 1 p.o. 3 times daily as needed. Continue lithium 300 mg, 1 p.o. nightly. Recommend therapy. Return in 6 to 8 weeks.    Donnal Moat, PA-C

## 2023-02-10 NOTE — Telephone Encounter (Signed)
Dad notified 

## 2023-03-03 ENCOUNTER — Other Ambulatory Visit: Payer: Self-pay | Admitting: Physician Assistant

## 2023-03-05 NOTE — Telephone Encounter (Signed)
What dose of duloxetine is she taking - 60 or 90

## 2023-03-08 NOTE — Telephone Encounter (Signed)
LVM to RC 

## 2023-03-20 ENCOUNTER — Ambulatory Visit (INDEPENDENT_AMBULATORY_CARE_PROVIDER_SITE_OTHER): Payer: Self-pay | Admitting: Physician Assistant

## 2023-03-20 DIAGNOSIS — Z91199 Patient's noncompliance with other medical treatment and regimen due to unspecified reason: Secondary | ICD-10-CM

## 2023-03-20 NOTE — Progress Notes (Signed)
No show

## 2023-09-26 ENCOUNTER — Ambulatory Visit (INDEPENDENT_AMBULATORY_CARE_PROVIDER_SITE_OTHER): Payer: Commercial Managed Care - PPO | Admitting: Physician Assistant

## 2023-09-26 ENCOUNTER — Encounter: Payer: Self-pay | Admitting: Physician Assistant

## 2023-09-26 DIAGNOSIS — F900 Attention-deficit hyperactivity disorder, predominantly inattentive type: Secondary | ICD-10-CM | POA: Diagnosis not present

## 2023-09-26 DIAGNOSIS — F422 Mixed obsessional thoughts and acts: Secondary | ICD-10-CM

## 2023-09-26 DIAGNOSIS — F3341 Major depressive disorder, recurrent, in partial remission: Secondary | ICD-10-CM

## 2023-09-26 DIAGNOSIS — F411 Generalized anxiety disorder: Secondary | ICD-10-CM

## 2023-09-26 DIAGNOSIS — G43009 Migraine without aura, not intractable, without status migrainosus: Secondary | ICD-10-CM

## 2023-09-26 MED ORDER — AMPHETAMINE-DEXTROAMPHET ER 30 MG PO CP24: 30.0000 mg | ORAL_CAPSULE | Freq: Two times a day (BID) | ORAL | 0 refills | Status: DC

## 2023-09-26 MED ORDER — HYDROXYZINE HCL 50 MG PO TABS: 50.0000 mg | ORAL_TABLET | Freq: Three times a day (TID) | ORAL | 5 refills | Status: DC | PRN

## 2023-09-26 MED ORDER — DULOXETINE HCL 30 MG PO CPEP: 90.0000 mg | ORAL_CAPSULE | Freq: Every day | ORAL | 5 refills | Status: DC

## 2023-09-26 MED ORDER — GABAPENTIN 300 MG PO CAPS: 300.0000 mg | ORAL_CAPSULE | Freq: Two times a day (BID) | ORAL | 1 refills | Status: DC | PRN

## 2023-09-26 MED ORDER — ZOLMITRIPTAN 5 MG NA SOLN: NASAL | 0 refills | Status: AC

## 2023-09-26 NOTE — Progress Notes (Signed)
[facility]  Patient ID: Erin Sharp,  MRN: [id number]  PCP: Clayborn Heron, MD  Date of Evaluation: 09/26/2023  time spent:20 minutes  Chief Complaint:  Chief Complaint   Depression; Anxiety; Follow-up    HISTORY/CURRENT STATUS: HPI  For routine med check.  Very anxious at times, worries about things, like worrying about a cat jumping in the oven when she's cooking, even though it's not likely at all. Also checks the door locks 3 times, nighttime rituals. Hydroxyzine doesn't help.   Moved to [place].  About 2 months ago to be with her boyfriend.  She will be coming back here off and on to visit family.  Patient is able to enjoy things.  Energy and motivation are good.  Work is going well.   No extreme sadness, tearfulness, or feelings of hopelessness.  Sleeps well most of the time. ADLs and personal hygiene are normal.   Appetite has not changed.  Weight is stable.  Denies suicidal or homicidal thoughts.  States that attention is good without easy distractibility.  Able to focus on things and finish tasks to completion.   Patient denies increased energy with decreased need for sleep, increased talkativeness, racing thoughts, impulsivity or risky behaviors, increased spending, increased libido, grandiosity, increased irritability or anger, paranoia, or hallucinations.  She has chronic migraines.  Ask for a prescription for Zomig nasal spray which her previous neurologist prescribed before he retired.  She is not having nausea or vomiting or any focal motor deficits when she has a migraine.  They are occurring more frequently, a couple of times a month now.  Zomig has always helped.  Denies dizziness, syncope, seizures, numbness, tingling, tremor, tics, unsteady gait, slurred speech, confusion. Denies muscle or joint pain, stiffness, or dystonia.  Individual Medical History/ Review of Systems: Changes? :No      Past medications for mental health diagnoses  include: Lexapro never helped, hydroxyzine is ineffective and cause drowsiness, Strattera caused nausea, Zoloft didn't help, Contempla was not effective up to 25.9 mg dose, Xanax, Trazodone didn't work unless she went to sleep in 20 mins.   Allergies: Patient has no known allergies.  Current Medications:  Current Outpatient Medications:    cholecalciferol (VITAMIN D3) 25 MCG (1000 UT) tablet, Take 1,000 Units by mouth daily., Disp: , Rfl:    [START ON 10/26/2023] amphetamine-dextroamphetamine (ADDERALL XR) 30 MG 24 hr capsule, Take 1 capsule (30 mg total) by mouth 2 (two) times daily with breakfast and lunch., Disp: 60 capsule, Rfl: 0   [START ON 11/24/2023] amphetamine-dextroamphetamine (ADDERALL XR) 30 MG 24 hr capsule, Take 1 capsule (30 mg total) by mouth 2 (two) times daily with breakfast and lunch., Disp: 60 capsule, Rfl: 0   amphetamine-dextroamphetamine (ADDERALL XR) 30 MG 24 hr capsule, Take 1 capsule (30 mg total) by mouth 2 (two) times daily with breakfast and lunch., Disp: 60 capsule, Rfl: 0   cetirizine (ZYRTEC) 10 MG tablet, Take 10 mg by mouth daily. (Patient not taking: Reported on 06/30/2022), Disp: , Rfl:    diclofenac (VOLTAREN) 75 MG EC tablet, Take 1 tablet (75 mg total) by mouth 2 (two) times daily. (Patient not taking: Reported on 09/26/2023), Disp: 60 tablet, Rfl: 0   DULoxetine (CYMBALTA) 30 MG capsule, Take 3 capsules (90 mg total) by mouth daily., Disp: 90 capsule, Rfl: 5   gabapentin (NEURONTIN) 300 MG capsule, Take 1 capsule (300 mg total) by mouth 2 (two) times daily as needed., Disp: 60 capsule, Rfl: 1  hydrOXYzine (ATARAX) 50 MG tablet, Take 1 tablet (50 mg total) by mouth 3 (three) times daily as needed., Disp: 90 tablet, Rfl: 5   zolmitriptan (ZOMIG) 5 MG nasal solution, 1 puff in nostril at onset of migraine, may repeat in 2 hours, Disp: 6 each, Rfl: 0 Medication Side Effects: none  Family Medical/ Social History: Changes? No  MENTAL HEALTH EXAM:  There were  no vitals taken for this visit.There is no height or weight on file to calculate BMI.  General Appearance: Casual and Well Groomed  Eye Contact:  Good  Speech:  Clear and Coherent and Normal Rate  Volume:  Normal  Mood:  Anxious  Affect:  Tearful  Thought Process:  Goal Directed and Descriptions of Associations: Circumstantial  Orientation:  Full (Time, Place, and Person)  Thought Content: Logical   Suicidal Thoughts:  No  Homicidal Thoughts:  No  Memory:  WNL  Judgement:  Good  Insight:  Good  Psychomotor Activity:  Normal  Concentration:  Concentration: Good and Attention Span: Good  Recall:  Good  Fund of Knowledge: Good  Language: Good  Assets:  Desire for Improvement  ADL's:  Intact  Cognition: WNL  Prognosis:  Good   DIAGNOSES:    ICD-10-CM   1. Mixed obsessional thoughts and acts  F42.2     2. Migraine without aura and without status migrainosus, not intractable  G43.009 zolmitriptan (ZOMIG) 5 MG nasal solution    3. Recurrent major depressive disorder, in partial remission (HCC)  F33.41     4. Attention deficit hyperactivity disorder (ADHD), predominantly inattentive type  F90.0     5. Generalized anxiety disorder  F41.1       Receiving Psychotherapy: Yes   RECOMMENDATIONS:  PDMP was reviewed.  Last Adderall filled 06/15/2023. I provided 20 minutes of face to face time during this encounter, including time spent before and after the visit in records review, medical decision making, counseling pertinent to today's visit, and charting.   We discussed the anxiety.  She has not been taking the gabapentin so I recommend restarting that.  She has also been off the Cymbalta for a couple of weeks because she was unable to get it.  She will restart that at 30 mg daily for 2 weeks.  Then increase to 60 mg daily.  Avoid caffeine.  Continue Adderall XR 30 mg, 1 p.o. every morning and 1 p.o. at lunch. Restart Cymbalta 30 mg daily for 2 weeks and then increase to 60 mg  daily. Restart gabapentin 300 mg, 1 p.o. twice daily. Continue hydroxyzine to 50 mg, 1 p.o. 3 times daily as needed. Restart Zomig 5 mg, 1 puff nasally at the onset of a migraine.  May repeat in 2 hours. Recommended she see Dr. Lenord Carbo.  She will call and get an appointment.  If she needs a formal referral she will let me know. Recommend therapy. Return in 6 weeks.    Melony Overly, PA-C

## 2023-10-24 ENCOUNTER — Other Ambulatory Visit: Payer: Self-pay | Admitting: Physician Assistant

## 2023-11-07 ENCOUNTER — Other Ambulatory Visit: Payer: Self-pay | Admitting: Physician Assistant

## 2023-11-08 NOTE — Telephone Encounter (Signed)
Called pt, unable to lvm.

## 2023-11-09 NOTE — Telephone Encounter (Signed)
LVM per DPR. Want to verify dose of duloxetine.

## 2023-11-14 ENCOUNTER — Ambulatory Visit: Payer: Commercial Managed Care - PPO | Admitting: Physician Assistant

## 2023-12-29 ENCOUNTER — Telehealth (INDEPENDENT_AMBULATORY_CARE_PROVIDER_SITE_OTHER): Payer: Self-pay | Admitting: Physician Assistant

## 2023-12-29 DIAGNOSIS — Z91199 Patient's noncompliance with other medical treatment and regimen due to unspecified reason: Secondary | ICD-10-CM

## 2023-12-29 NOTE — Progress Notes (Signed)
Attempted to reach her twice for telehealth. Unable to contact. No show

## 2024-06-20 ENCOUNTER — Ambulatory Visit: Admitting: Obstetrics and Gynecology

## 2024-06-20 ENCOUNTER — Encounter: Payer: Self-pay | Admitting: Obstetrics and Gynecology

## 2024-06-20 VITALS — BP 120/85 | HR 111 | Ht 67.0 in | Wt 183.0 lb

## 2024-06-20 DIAGNOSIS — Z3202 Encounter for pregnancy test, result negative: Secondary | ICD-10-CM

## 2024-06-20 DIAGNOSIS — N939 Abnormal uterine and vaginal bleeding, unspecified: Secondary | ICD-10-CM | POA: Diagnosis not present

## 2024-06-20 DIAGNOSIS — R002 Palpitations: Secondary | ICD-10-CM

## 2024-06-20 DIAGNOSIS — R55 Syncope and collapse: Secondary | ICD-10-CM

## 2024-06-20 DIAGNOSIS — Z1331 Encounter for screening for depression: Secondary | ICD-10-CM

## 2024-06-20 DIAGNOSIS — R5383 Other fatigue: Secondary | ICD-10-CM | POA: Diagnosis not present

## 2024-06-20 NOTE — Progress Notes (Addendum)
   NEW GYNECOLOGY VISIT  Subjective:  Erin Sharp is a 28 y.o. G0P0000 with LMP 06/16/24 presenting for discussion of fatigue, syncopal episodes and menstrual irregularity  Has been struggling with severe fatigue/feeling exhausted, brain fog, dizziness, issues with body temperature control/hot flashes, insomnia, and syncopal/pre-syncopal episodes.   She has known history of anxiety/depression/ADD and currently takes adderall , cymbalta , and hydroxyzine  prn. She's had several physicians attribute her symptoms to anxiety. No labs/testing is available in her chart or Care Everywhere.   Her pre-syncopal episodes are significantly affecting her quality of life. They feel distinctly different from her panic attacks which she can control with medication and coping strategies she's learned. They are triggered by standing for long periods of time or going from sitting to standing. It is limiting her exercise tolerance. She has started taking electrolytes and notices some improvement. She does not have chest pain/SOB, but will feel a fluttery sensation in her chest. She misses significant events with family/friends and work due to these symptoms. She has not been evaluated for POTS. Notes frequent syncopal episodes in high school that didn't have an identified cause - POTS was suggested but then she was told it wasn't possible since she was able to tolerate her ballet classes.   Regarding sleep, she really struggles to fall asleep. She does snore inconsistently. Sleep is not restful. Has tried several medications to help with sleep including gabapentin , trazodone , and remeron . Has tried strattera  and cotempla  for ADD in the past as well.   She also has menstrual irregularity. Menarche at 28yo. Cycles have never been regular. She will skip 1-2 months at a time. Length varies 3-7 days with variable heaviness. Is not saturating pads in <1-2 hours. Periods are painful, but usually manageable. Occasionally misses  work/school from period pain. Uses condoms for contraception  Objective:   Vitals:   06/20/24 1555 06/20/24 1558  BP: 120/85   Pulse: (!) 139 (!) 111  Weight: 183 lb (83 kg)   Height: 5' 7 (1.702 m)    General:  Alert, oriented and cooperative. Patient is in no acute distress.  Skin: Skin is warm and dry. No rash noted.   Cardiovascular: Mild tachycardia noted  Respiratory: Normal respiratory effort, no problems with respiration noted    Assessment and Plan:  Erin Sharp is a 28 y.o. with AUB, palpitations, pre-syncopal episodes and severe fatifue  1. Other fatigue (Primary) 2. Syncope, unspecified syncope type 3. Palpitations Will screen for common causes of fatigue including hypothyroidism, vit D deficiency, B12 deficiency Refer to cardiology for consideration of zio patch Pending these results, may also benefit from OSA testing - Comp Met (CMET) - B12 and Folate Panel - VITAMIN D 25 Hydroxy (Vit-D Deficiency, Fractures) - Magnesium - CBC with Differential/Platelet - Ambulatory referral to Cardiology  4. Abnormal uterine bleeding (AUB) Suspect anovulatory cycles based on bleeding pattern UPT negative -     TSH Rfx on Abnormal to Free T4 -     Prolactin -     Estradiol -     FSH -     Testosterone -     CBC with Differential/Platelet  Return in about 2 weeks (around 07/04/2024) for virtual visit for follow up labs.  Future Appointments  Date Time Provider Department Center  06/25/2024  1:00 PM Rhys Verneita ONEIDA DEVONNA [facility] None  07/03/2024  2:50 PM Erik Kieth BROCKS, MD [facility]   Total encounter time: 48 minutes  Kieth BROCKS Erik, MD

## 2024-06-20 NOTE — Addendum Note (Signed)
 Addended by: ERIK FELTS on: 06/20/2024 05:39 PM   Modules accepted: Orders

## 2024-06-21 LAB — POCT URINE PREGNANCY: Preg Test, Ur: NEGATIVE

## 2024-06-21 NOTE — Addendum Note (Signed)
 Addended by: ORLINDA SILVANO ORN on: 06/21/2024 07:51 AM   Modules accepted: Orders

## 2024-06-22 ENCOUNTER — Ambulatory Visit: Payer: Self-pay | Admitting: Obstetrics and Gynecology

## 2024-06-22 DIAGNOSIS — E221 Hyperprolactinemia: Secondary | ICD-10-CM

## 2024-06-22 LAB — CBC WITH DIFFERENTIAL/PLATELET
Basophils Absolute: 0 x10E3/uL (ref 0.0–0.2)
Basos: 1 %
EOS (ABSOLUTE): 0.4 x10E3/uL (ref 0.0–0.4)
Eos: 6 %
Hematocrit: 44.2 % (ref 34.0–46.6)
Hemoglobin: 14.2 g/dL (ref 11.1–15.9)
Immature Grans (Abs): 0 x10E3/uL (ref 0.0–0.1)
Immature Granulocytes: 0 %
Lymphocytes Absolute: 2.9 x10E3/uL (ref 0.7–3.1)
Lymphs: 45 %
MCH: 29.6 pg (ref 26.6–33.0)
MCHC: 32.1 g/dL (ref 31.5–35.7)
MCV: 92 fL (ref 79–97)
Monocytes Absolute: 0.4 x10E3/uL (ref 0.1–0.9)
Monocytes: 5 %
Neutrophils Absolute: 2.8 x10E3/uL (ref 1.4–7.0)
Neutrophils: 43 %
Platelets: 272 x10E3/uL (ref 150–450)
RBC: 4.8 x10E6/uL (ref 3.77–5.28)
RDW: 12.8 % (ref 11.7–15.4)
WBC: 6.4 x10E3/uL (ref 3.4–10.8)

## 2024-06-22 LAB — COMPREHENSIVE METABOLIC PANEL WITH GFR
ALT: 19 IU/L (ref 0–32)
AST: 18 IU/L (ref 0–40)
Albumin: 5.1 g/dL — ABNORMAL HIGH (ref 4.0–5.0)
Alkaline Phosphatase: 89 IU/L (ref 44–121)
BUN/Creatinine Ratio: 19 (ref 9–23)
BUN: 15 mg/dL (ref 6–20)
Bilirubin Total: 0.4 mg/dL (ref 0.0–1.2)
CO2: 22 mmol/L (ref 20–29)
Calcium: 10.3 mg/dL — ABNORMAL HIGH (ref 8.7–10.2)
Chloride: 101 mmol/L (ref 96–106)
Creatinine, Ser: 0.79 mg/dL (ref 0.57–1.00)
Globulin, Total: 2.5 g/dL (ref 1.5–4.5)
Glucose: 106 mg/dL — ABNORMAL HIGH (ref 70–99)
Potassium: 4.2 mmol/L (ref 3.5–5.2)
Sodium: 141 mmol/L (ref 134–144)
Total Protein: 7.6 g/dL (ref 6.0–8.5)
eGFR: 106 mL/min/1.73 (ref >59)

## 2024-06-22 LAB — B12 AND FOLATE PANEL
Folate: 7.1 ng/mL (ref >3.0)
Vitamin B-12: 413 pg/mL (ref 232–1245)

## 2024-06-22 LAB — PROLACTIN: Prolactin: 83.4 ng/mL — ABNORMAL HIGH (ref 4.8–33.4)

## 2024-06-22 LAB — ESTRADIOL: Estradiol: 12.6 pg/mL

## 2024-06-22 LAB — VITAMIN D 25 HYDROXY (VIT D DEFICIENCY, FRACTURES): Vit D, 25-Hydroxy: 15.7 ng/mL — ABNORMAL LOW (ref 30.0–100.0)

## 2024-06-22 LAB — TSH RFX ON ABNORMAL TO FREE T4: TSH: 3.8 u[IU]/mL (ref 0.450–4.500)

## 2024-06-22 LAB — MAGNESIUM: Magnesium: 2.3 mg/dL (ref 1.6–2.3)

## 2024-06-22 LAB — TESTOSTERONE: Testosterone: 24 ng/dL (ref 13–71)

## 2024-06-22 LAB — FOLLICLE STIMULATING HORMONE: FSH: 6 m[IU]/mL

## 2024-06-22 MED ORDER — VITAMIN D3 1000 UNITS PO CAPS: 1.0000 | ORAL_CAPSULE | Freq: Every day | ORAL | 0 refills | Status: AC

## 2024-06-25 ENCOUNTER — Encounter: Payer: Self-pay | Admitting: Physician Assistant

## 2024-06-25 ENCOUNTER — Ambulatory Visit (INDEPENDENT_AMBULATORY_CARE_PROVIDER_SITE_OTHER): Admitting: Physician Assistant

## 2024-06-25 DIAGNOSIS — G4700 Insomnia, unspecified: Secondary | ICD-10-CM

## 2024-06-25 DIAGNOSIS — F422 Mixed obsessional thoughts and acts: Secondary | ICD-10-CM

## 2024-06-25 DIAGNOSIS — F331 Major depressive disorder, recurrent, moderate: Secondary | ICD-10-CM | POA: Diagnosis not present

## 2024-06-25 DIAGNOSIS — F900 Attention-deficit hyperactivity disorder, predominantly inattentive type: Secondary | ICD-10-CM

## 2024-06-25 MED ORDER — DULOXETINE HCL 30 MG PO CPEP: 90.0000 mg | ORAL_CAPSULE | Freq: Every day | ORAL | 1 refills | Status: DC

## 2024-06-25 MED ORDER — ZALEPLON 10 MG PO CAPS: ORAL_CAPSULE | ORAL | 1 refills | Status: DC

## 2024-06-25 MED ORDER — AMPHETAMINE-DEXTROAMPHET ER 30 MG PO CP24: 30.0000 mg | ORAL_CAPSULE | Freq: Two times a day (BID) | ORAL | 0 refills | Status: DC

## 2024-06-25 MED ORDER — HYDROXYZINE HCL 50 MG PO TABS
50.0000 mg | ORAL_TABLET | Freq: Three times a day (TID) | ORAL | 1 refills | Status: AC | PRN
Start: 2024-06-25 — End: ?

## 2024-06-25 NOTE — Progress Notes (Signed)
 [facility]  Patient ID: Erin Sharp,  MRN: [id number]  PCP: Loretha Richerd SAUNDERS, MD  Date of Evaluation: 06/25/2024  time spent:30 minutes  Chief Complaint:  Chief Complaint   ADHD; Anxiety; Depression; Follow-up    HISTORY/CURRENT STATUS: HPI  8 months overdue for appt  Gabapentin  quit working so she stopped it.  Very anxious right now d/t things at work and personal life. Plans to start back to college within the next year.  Hydroxyzine  helps anxiety but not sleep. Going to sleep is hard but also wakes up with nightmares a few nights a week.   Patient is able to enjoy things.  Energy and motivation are good.  Work is going well.   No extreme sadness, tearfulness, or feelings of hopelessness.  ADLs and personal hygiene are normal.   Appetite has not changed.  Weight is stable.  Adderall  is still helpful when needed. States that attention is good without easy distractibility.  Able to focus on things and finish tasks to completion.  No mania, delirium, AH/VH.  No SI/HI.  Denies dizziness, syncope, seizures, numbness, tingling, tremor, tics, unsteady gait, slurred speech, confusion. Denies muscle or joint pain, stiffness, or dystonia.  Individual Medical History/ Review of Systems: Changes? :Yes    diagnosed with elevated prolactin level.  Workup underway.  Past medications for mental health diagnoses include: Lexapro  never helped, hydroxyzine  is ineffective and cause drowsiness, Strattera  caused nausea, Zoloft  didn't help, Contempla was not effective up to 25.9 mg dose, Xanax , Trazodone  didn't work unless she went to sleep in 20 mins. Mirtazapine   Allergies: Patient has no known allergies.  Current Medications:  Current Outpatient Medications:    zaleplon  (SONATA ) 10 MG capsule, 1 po q hs prn and may repeat 1 po for MNA prn, Disp: 30 capsule, Rfl: 1   zolmitriptan  (ZOMIG ) 5 MG nasal solution, 1 puff in nostril at onset of migraine, may repeat in 2 hours, Disp: 6  each, Rfl: 0   amphetamine -dextroamphetamine  (ADDERALL  XR) 30 MG 24 hr capsule, Take 1 capsule (30 mg total) by mouth 2 (two) times daily with breakfast and lunch., Disp: 60 capsule, Rfl: 0   Cholecalciferol (VITAMIN D3) 1000 units CAPS, Take 1 capsule by mouth daily., Disp: 90 capsule, Rfl: 0   DULoxetine  (CYMBALTA ) 30 MG capsule, Take 3 capsules (90 mg total) by mouth daily., Disp: 270 capsule, Rfl: 1   hydrOXYzine  (ATARAX ) 50 MG tablet, Take 1 tablet (50 mg total) by mouth 3 (three) times daily as needed., Disp: 270 tablet, Rfl: 1 Medication Side Effects: none  Family Medical/ Social History: Changes? No  MENTAL HEALTH EXAM:  Last menstrual period 06/16/2024.There is no height or weight on file to calculate BMI.  General Appearance: Casual and Well Groomed  Eye Contact:  Good  Speech:  Clear and Coherent and Normal Rate  Volume:  Normal  Mood:  Anxious  Affect:  Congruent  Thought Process:  Goal Directed and Descriptions of Associations: Circumstantial  Orientation:  Full (Time, Place, and Person)  Thought Content: Logical   Suicidal Thoughts:  No  Homicidal Thoughts:  No  Memory:  WNL  Judgement:  Good  Insight:  Good  Psychomotor Activity:  Normal  Concentration:  Concentration: Good and Attention Span: Good  Recall:  Good  Fund of Knowledge: Good  Language: Good  Assets:  Desire for Improvement  ADL's:  Intact  Cognition: WNL  Prognosis:  Good   Labs 06/21/2024 Prolactin level 83.4 All labs on chart reviewed.  DIAGNOSES:    ICD-10-CM   1. Major depressive disorder, recurrent episode, moderate (HCC)  F33.1     2. Mixed obsessional thoughts and acts  F42.2     3. Attention deficit hyperactivity disorder (ADHD), predominantly inattentive type  F90.0     4. Insomnia, unspecified type  G47.00       Receiving Psychotherapy: Yes   RECOMMENDATIONS:  PDMP was reviewed.  Last Adderall  filled 02/12/2024. I provided approximately  30 minutes of face to face time  during this encounter, including time spent before and after the visit in records review, medical decision making, counseling pertinent to today's visit, and charting.   Sleep hygiene was discussed.  She has taken both trazodone  and mirtazapine  in the past without benefit.  Other options would be amitriptyline or Seroquel to name a few.  Because of her current elevated prolactin level I prefer not to use either of those drugs.  Therefore I am choosing Sonata , patient understands this is a controlled substance and to take as sparingly as possible.  Benefits, risk and side effects were discussed and she accepts.  I recommend increasing the Cymbalta  for her symptoms of anxiety and depression.  Continue Adderall  XR 30 mg, 1 p.o. every morning and 1 p.o. at lunch. Increase Cymbalta  30 mg to 3 pills daily. Continue hydroxyzine   50 mg, 1 p.o. 3 times daily as needed. Start Sonata  10 mg, 1 p.o. nightly as needed sleep and may repeat 1 for mid nocturnal awakening as long as she has 3 hours left to sleep. Return in 6 to 8 weeks.  Verneita Cooks, PA-C

## 2024-07-03 ENCOUNTER — Telehealth: Admitting: Obstetrics and Gynecology

## 2024-07-03 NOTE — Progress Notes (Signed)
 Erroneous encounter

## 2024-07-25 ENCOUNTER — Telehealth: Payer: Self-pay | Admitting: Obstetrics and Gynecology

## 2024-07-25 NOTE — Telephone Encounter (Signed)
 Attempted to reach patient to have her call the office of Dr. Sheena to schedule an appointment. Informed her they have been trying to reach her to get her scheduled. Message left on voicemail.

## 2024-07-31 ENCOUNTER — Encounter: Payer: Self-pay | Admitting: *Deleted

## 2024-08-06 ENCOUNTER — Ambulatory Visit: Admitting: Physician Assistant

## 2024-08-08 ENCOUNTER — Other Ambulatory Visit: Payer: Self-pay | Admitting: Obstetrics and Gynecology

## 2024-08-08 ENCOUNTER — Encounter: Payer: Self-pay | Admitting: Obstetrics and Gynecology

## 2024-08-08 MED ORDER — DIAZEPAM 5 MG PO TABS: 5.0000 mg | ORAL_TABLET | Freq: Once | ORAL | 0 refills | Status: AC

## 2024-08-08 NOTE — Progress Notes (Signed)
 Pre medication for MRI sent

## 2024-08-12 ENCOUNTER — Ambulatory Visit
Admission: RE | Admit: 2024-08-12 | Discharge: 2024-08-12 | Disposition: A | Discharge status: Home / Self Care | Source: Ambulatory Visit | Attending: Obstetrics and Gynecology | Admitting: Obstetrics and Gynecology

## 2024-08-12 DIAGNOSIS — E221 Hyperprolactinemia: Secondary | ICD-10-CM

## 2024-08-12 MED ORDER — GADOPICLENOL 0.5 MMOL/ML IV SOLN
8.0000 mL | Freq: Once | INTRAVENOUS | Status: AC | PRN
  Administered 2024-08-12: 8 mL via INTRAVENOUS

## 2024-08-20 ENCOUNTER — Other Ambulatory Visit: Payer: Self-pay | Admitting: Obstetrics and Gynecology

## 2024-08-20 ENCOUNTER — Encounter: Payer: Self-pay | Admitting: Obstetrics and Gynecology

## 2024-08-20 DIAGNOSIS — R55 Syncope and collapse: Secondary | ICD-10-CM

## 2024-08-20 DIAGNOSIS — R002 Palpitations: Secondary | ICD-10-CM

## 2024-08-20 NOTE — Progress Notes (Signed)
 Cardio referral re-ordered

## 2024-09-11 ENCOUNTER — Encounter: Payer: Self-pay | Admitting: *Deleted

## 2024-09-15 ENCOUNTER — Other Ambulatory Visit: Payer: Self-pay | Admitting: Obstetrics and Gynecology

## 2024-09-18 ENCOUNTER — Telehealth: Payer: Self-pay | Admitting: Obstetrics and Gynecology

## 2024-09-18 DIAGNOSIS — E221 Hyperprolactinemia: Secondary | ICD-10-CM

## 2024-09-18 NOTE — Telephone Encounter (Signed)
 Pt plans to come in Friday 10/24 AM for fasting 8am prolactin. Lab ordered

## 2024-09-24 ENCOUNTER — Ambulatory Visit (INDEPENDENT_AMBULATORY_CARE_PROVIDER_SITE_OTHER): Admitting: Physician Assistant

## 2024-09-24 ENCOUNTER — Encounter: Payer: Self-pay | Admitting: Physician Assistant

## 2024-09-24 DIAGNOSIS — F900 Attention-deficit hyperactivity disorder, predominantly inattentive type: Secondary | ICD-10-CM | POA: Diagnosis not present

## 2024-09-24 DIAGNOSIS — F3341 Major depressive disorder, recurrent, in partial remission: Secondary | ICD-10-CM | POA: Diagnosis not present

## 2024-09-24 DIAGNOSIS — F411 Generalized anxiety disorder: Secondary | ICD-10-CM

## 2024-09-24 MED ORDER — AMPHETAMINE-DEXTROAMPHET ER 30 MG PO CP24: 30.0000 mg | ORAL_CAPSULE | Freq: Two times a day (BID) | ORAL | 0 refills | Status: DC

## 2024-09-24 NOTE — Progress Notes (Signed)
 [facility]  Patient ID: Erin Sharp,  MRN: [id number]  PCP: Loretha Richerd SAUNDERS, MD  Date of Evaluation: 09/24/2024  Time spent:20 minutes  Chief Complaint:  Chief Complaint   Depression; ADD; Anxiety; Follow-up    HISTORY/CURRENT STATUS: HPI  For 3 month med check.   She and fiance broke up; 'I know it's for the best but it stinks right now. '  She is having some trouble sleeping and knows that stressor is a part of the issue.  States she does not want to change any medications, knows that wants the circumstances improve, the sleep likely will too.  Patient is able to enjoy things.  Energy and motivation are good.  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   No extreme sadness, tearfulness, or feelings of hopelessness.  ADLs and personal hygiene are normal.   Appetite has not changed.  Weight is stable.  The Adderall  is still effective.  States that attention is good without easy distractibility.  Able to focus on things and finish tasks to completion.  No mania, delirium, AH/VH.  No SI/HI.  Individual Medical History/ Review of Systems: Changes? :No      Past medications for mental health diagnoses include: Lexapro  never helped, hydroxyzine  is ineffective and cause drowsiness, Strattera  caused nausea, Zoloft  didn't help, Contempla was not effective up to 25.9 mg dose, Xanax , Trazodone  didn't work unless she went to sleep in 20 mins. Mirtazapine   Allergies: Patient has no known allergies.  Current Medications:  Current Outpatient Medications:    [START ON 10/22/2024] amphetamine -dextroamphetamine  (ADDERALL  XR) 30 MG 24 hr capsule, Take 1 capsule (30 mg total) by mouth 2 (two) times daily with breakfast and lunch., Disp: 60 capsule, Rfl: 0   amphetamine -dextroamphetamine  (ADDERALL  XR) 30 MG 24 hr capsule, Take 1 capsule (30 mg total) by mouth 2 (two) times daily with breakfast and lunch., Disp: 60 capsule, Rfl: 0   Cholecalciferol  (VITAMIN D3) 1000 units CAPS, Take 1 capsule by mouth daily., Disp: 90 capsule, Rfl: 0   DULoxetine  (CYMBALTA ) 30 MG capsule, Take 3 capsules (90 mg total) by mouth daily., Disp: 270 capsule, Rfl: 1   hydrOXYzine  (ATARAX ) 50 MG tablet, Take 1 tablet (50 mg total) by mouth 3 (three) times daily as needed., Disp: 270 tablet, Rfl: 1   zolmitriptan  (ZOMIG ) 5 MG nasal solution, 1 puff in nostril at onset of migraine, may repeat in 2 hours, Disp: 6 each, Rfl: 0   [START ON 11/19/2024] amphetamine -dextroamphetamine  (ADDERALL  XR) 30 MG 24 hr capsule, Take 1 capsule (30 mg total) by mouth 2 (two) times daily with breakfast and lunch., Disp: 60 capsule, Rfl: 0 Medication Side Effects: none  Family Medical/ Social History: Changes? She and fiance broke up.   MENTAL HEALTH EXAM:  There were no vitals taken for this visit.There is no height or weight on file to calculate BMI.  General Appearance: Casual and Well Groomed  Eye Contact:  Good  Speech:  Clear and Coherent and Normal Rate  Volume:  Normal  Mood:  Euthymic  Affect:  Congruent  Thought Process:  Goal Directed and Descriptions of Associations: Circumstantial  Orientation:  Full (Time, Place, and Person)  Thought Content: Logical   Suicidal Thoughts:  No  Homicidal Thoughts:  No  Memory:  WNL  Judgement:  Good  Insight:  Good  Psychomotor Activity:  Normal  Concentration:  Concentration: Good and Attention Span: Good  Recall:  Good  Fund  of Knowledge: Good  Language: Good  Assets:  Desire for Improvement  ADL's:  Intact  Cognition: WNL  Prognosis:  Good    DIAGNOSES:    ICD-10-CM   1. Attention deficit hyperactivity disorder (ADHD), predominantly inattentive type  F90.0     2. Recurrent major depressive disorder, in partial remission  F33.41     3. Generalized anxiety disorder  F41.1       Receiving Psychotherapy: No   RECOMMENDATIONS:  PDMP was reviewed.  Last Adderall  filled 08/08/2024.  Sonata  filled 06/25/2024. I  provided approximately 20 minutes of face to face time during this encounter, including time spent before and after the visit in records review, medical decision making, counseling pertinent to today's visit, and charting.   Sleep hygiene briefly discussed.  She is doing well with all her medications so no changes will be made.  Continue Adderall  XR 30 mg, 1 p.o. every morning and 1 p.o. at lunch. Continue Cymbalta  30 mg to 3 pills daily. Continue hydroxyzine   50 mg, 1 p.o. 3 times daily as needed. Return in  3 months.     Verneita Cooks, PA-C

## 2024-09-27 ENCOUNTER — Ambulatory Visit: Admitting: Cardiology

## 2024-10-01 ENCOUNTER — Telehealth: Payer: Self-pay | Admitting: *Deleted

## 2024-10-01 NOTE — Telephone Encounter (Signed)
 Left mother of patient a message about a lab that the patient was going to get back in October that is still showing as a future order.

## 2024-10-02 ENCOUNTER — Other Ambulatory Visit: Payer: Self-pay

## 2024-10-02 DIAGNOSIS — E221 Hyperprolactinemia: Secondary | ICD-10-CM

## 2024-10-03 ENCOUNTER — Ambulatory Visit: Payer: Self-pay | Admitting: Obstetrics and Gynecology

## 2024-10-03 LAB — PROLACTIN: Prolactin: 75.7 ng/mL — ABNORMAL HIGH (ref 4.8–33.4)

## 2024-10-10 ENCOUNTER — Other Ambulatory Visit

## 2024-10-10 ENCOUNTER — Encounter: Payer: Self-pay | Admitting: "Endocrinology

## 2024-10-10 ENCOUNTER — Ambulatory Visit: Admitting: "Endocrinology

## 2024-10-10 VITALS — BP 120/70 | HR 112 | Ht 67.0 in | Wt 196.0 lb

## 2024-10-10 DIAGNOSIS — N913 Primary oligomenorrhea: Secondary | ICD-10-CM

## 2024-10-10 DIAGNOSIS — E221 Hyperprolactinemia: Secondary | ICD-10-CM

## 2024-10-10 DIAGNOSIS — Z0000 Encounter for general adult medical examination without abnormal findings: Secondary | ICD-10-CM

## 2024-10-10 DIAGNOSIS — E559 Vitamin D deficiency, unspecified: Secondary | ICD-10-CM

## 2024-10-10 DIAGNOSIS — R232 Flushing: Secondary | ICD-10-CM

## 2024-10-10 MED ORDER — VITAMIN D (ERGOCALCIFEROL) 1.25 MG (50000 UNIT) PO CAPS: 50000.0000 [IU] | ORAL_CAPSULE | ORAL | 0 refills | Status: AC
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
## 2024-10-11 LAB — LIPID PANEL
Cholesterol: 224 mg/dL — ABNORMAL HIGH (ref <200)
HDL: 51 mg/dL (ref >=50)
LDL Cholesterol (Calc): 154 mg/dL — ABNORMAL HIGH
Non-HDL Cholesterol (Calc): 173 mg/dL — ABNORMAL HIGH (ref <130)
Total CHOL/HDL Ratio: 4.4 (calc) (ref <5.0)
Triglycerides: 88 mg/dL (ref <150)

## 2024-10-11 LAB — HEMOGLOBIN A1C
Hgb A1c MFr Bld: 5.3 % (ref <5.7)
Mean Plasma Glucose: 105 mg/dL
eAG (mmol/L): 5.8 mmol/L

## 2024-10-11 LAB — T4, FREE: Free T4: 1.2 ng/dL (ref 0.8–1.8)

## 2024-12-17 ENCOUNTER — Other Ambulatory Visit: Payer: Self-pay | Admitting: Physician Assistant

## 2024-12-26 ENCOUNTER — Ambulatory Visit: Admitting: Physician Assistant

## 2024-12-26 ENCOUNTER — Encounter: Payer: Self-pay | Admitting: Physician Assistant

## 2024-12-26 DIAGNOSIS — F411 Generalized anxiety disorder: Secondary | ICD-10-CM

## 2024-12-26 DIAGNOSIS — E221 Hyperprolactinemia: Secondary | ICD-10-CM

## 2024-12-26 DIAGNOSIS — F900 Attention-deficit hyperactivity disorder, predominantly inattentive type: Secondary | ICD-10-CM | POA: Diagnosis not present

## 2024-12-26 DIAGNOSIS — F3341 Major depressive disorder, recurrent, in partial remission: Secondary | ICD-10-CM

## 2024-12-26 MED ORDER — AMPHETAMINE-DEXTROAMPHET ER 30 MG PO CP24: 30.0000 mg | ORAL_CAPSULE | Freq: Two times a day (BID) | ORAL | 0 refills | Status: AC

## 2024-12-26 MED ORDER — DULOXETINE HCL 30 MG PO CPEP: ORAL_CAPSULE | ORAL | Status: AC

## 2024-12-26 MED ORDER — FLUOXETINE HCL 20 MG PO CAPS: ORAL_CAPSULE | ORAL | 1 refills | Status: AC

## 2024-12-26 NOTE — Patient Instructions (Signed)
 Wean off of the Cymbalta  30 mg by taking 2 pills daily for 1 week and then go down to 1 pill daily for a week and then stop it. At the same time you will start the Prozac .

## 2024-12-26 NOTE — Progress Notes (Signed)
 "     [facility]  Patient ID: Erin Sharp,  MRN: [id number]  PCP: Patient, No Pcp Per  Date of Evaluation: 12/26/2024 Time spent:30 minutes  Chief Complaint:  Chief Complaint   ADHD; Anxiety; Depression; Follow-up    HISTORY/CURRENT STATUS: HPI  For 3 month med check.   Would like to change the Cymbalta  to something else that may not increase her Prolactin level. See note from 10/10/2024 from Endocrinology, Dr. Dartha mola.  She is not having galactorrhea.  Is going through a hard time in her life, like the fall out of breaking up with her fiance, her health issues of dizziness and near syncope for years and no one can tell her what's wrong.  Tired a lot.  Not sleeping well.  Takes Melatonin sometimes it works and sometimes it doesn't.  Same for the Hydroxyzine .  Trouble falling asleep.  It can take hours sometimes.  Once she's asleep, she's ok.  Rarely naps during the day.  She already practices good sleep hygiene.  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   ADLs and personal hygiene are normal.   Appetite has decreased.  She does eat though.   Denies laxative use, calorie restricting, or binging and purging.  She is staying with her parents right now so they can make sure that she does take care of herself.  She feels sad a lot.  ADLs and personal hygiene are normal.   No mania, delirium, AH/VH.  She does not have a plan for suicide but she does feel discouraged about her physical health issues and thinks what is the use?  States she will not harm herself however.  No homicidal thoughts.  The Adderall  is still effective. States that attention is good without easy distractibility.  Able to focus on things and finish tasks to completion.   Individual Medical History/ Review of Systems: Changes? :Yes  See HPI     Past medications for mental health diagnoses include: Lexapro  never helped, hydroxyzine  is ineffective and cause drowsiness, Wellbutrin   caused PA, Strattera  caused nausea, Zoloft  didn't help, Contempla was not effective up to 25.9 mg dose, Xanax , Trazodone  didn't work unless she went to sleep in 20 mins. Mirtazapine   Allergies: Patient has no known allergies.  Current Medications:  Current Outpatient Medications:    Cholecalciferol (VITAMIN D3) 1000 units CAPS, Take 1 capsule by mouth daily., Disp: 90 capsule, Rfl: 0   FLUoxetine  (PROZAC ) 20 MG capsule, 1 p.o. every morning for 1 week and then increase to 2 p.o. every morning, Disp: 60 capsule, Rfl: 1   hydrOXYzine  (ATARAX ) 50 MG tablet, Take 1 tablet (50 mg total) by mouth 3 (three) times daily as needed., Disp: 270 tablet, Rfl: 1   Vitamin D , Ergocalciferol , (DRISDOL ) 1.25 MG (50000 UNIT) CAPS capsule, Take 1 capsule (50,000 Units total) by mouth every 7 (seven) days., Disp: 12 capsule, Rfl: 0   zolmitriptan  (ZOMIG ) 5 MG nasal solution, 1 puff in nostril at onset of migraine, may repeat in 2 hours, Disp: 6 each, Rfl: 0   amphetamine -dextroamphetamine  (ADDERALL  XR) 30 MG 24 hr capsule, Take 1 capsule (30 mg total) by mouth 2 (two) times daily with breakfast and lunch., Disp: 60 capsule, Rfl: 0   [START ON 02/18/2025] amphetamine -dextroamphetamine  (ADDERALL  XR) 30 MG 24 hr capsule, Take 1 capsule (30 mg total) by mouth 2 (two) times daily with breakfast and lunch., Disp: 60 capsule, Rfl: 0   [START  ON 01/23/2025] amphetamine -dextroamphetamine  (ADDERALL  XR) 30 MG 24 hr capsule, Take 1 capsule (30 mg total) by mouth 2 (two) times daily with breakfast and lunch., Disp: 60 capsule, Rfl: 0   DULoxetine  (CYMBALTA ) 30 MG capsule, 2 p.o. daily for 1 week then decrease to 1 p.o. daily for 1 week and then stop, Disp: , Rfl:  Medication Side Effects: none  Family Medical/ Social History: Changes? See HPI  MENTAL HEALTH EXAM:  There were no vitals taken for this visit.There is no height or weight on file to calculate BMI.  General Appearance: Casual and Well Groomed  Eye Contact:  Good   Speech:  Clear and Coherent and Normal Rate  Volume:  Normal  Mood:  sad  Affect:  Congruent  Thought Process:  Goal Directed and Descriptions of Associations: Circumstantial  Orientation:  Full (Time, Place, and Person)  Thought Content: Logical   Suicidal Thoughts:  No  Homicidal Thoughts:  No  Memory:  WNL  Judgement:  Good  Insight:  Good  Psychomotor Activity:  Normal  Concentration:  Concentration: Good and Attention Span: Good  Recall:  Good  Fund of Knowledge: Good  Language: Good  Assets:  Desire for Improvement  ADL's:  Intact  Cognition: WNL  Prognosis:  Good   DIAGNOSES:    ICD-10-CM   1. Attention deficit hyperactivity disorder (ADHD), predominantly inattentive type  F90.0     2. Generalized anxiety disorder  F41.1     3. Recurrent major depressive disorder, in partial remission  F33.41     4. Hyperprolactinemia  E22.1      Receiving Psychotherapy: No   RECOMMENDATIONS:  PDMP was reviewed.  Last Adderall  filled 10/23/2024.  Valium  filled 08/08/2024.  Only 1 pill.  Sonata  filled 06/25/2024. I provided approximately  30 minutes of face to face time during this encounter, including time spent before and after the visit in records review, medical decision making, counseling pertinent to today's visit, and charting.   We discussed the elevated prolactin level.  Her endocrinologist thinks it may be caused from Cymbalta .  I have never seen that, but it does not mean it cannot happen.  The Cymbalta  is not effective for her depression symptoms either so for both of those reasons I recommend we change to Prozac .  She does not want to gain weight and out of the antidepressants, Prozac  is the least likely of the generic medications, other than Wellbutrin  which she has tried before and it caused anxiety, to cause weight gain.  We discussed the benefits, risk and side effects of Prozac , and the pros and cons of changing.  She would like to try it.  I am not sure if that will  bring the prolactin back to a normal level or not but it is definitely worth a try.  Wean off Cymbalta  30 mg by taking 2 p.o. daily for 1 week and then decrease to 1 p.o. daily for 1 week and then stop.  These instructions are written on the AVS.  She understands.  Continue Adderall  XR 30 mg, 1 p.o. every morning and 1 p.o. at lunch. Start Prozac  20 mg, 1 daily for 1 week and then increase to 2 p.o. daily. Continue hydroxyzine   50 mg, 1 p.o. 3 times daily as needed. Return in  6 weeks.   Verneita Cooks, PA-C  "

## 2025-02-06 ENCOUNTER — Ambulatory Visit: Admitting: Physician Assistant

## 2025-04-03 ENCOUNTER — Other Ambulatory Visit

## 2025-04-10 ENCOUNTER — Ambulatory Visit: Admitting: "Endocrinology

## 2025-04-10 ENCOUNTER — Ambulatory Visit: Admitting: Internal Medicine
